# Patient Record
Sex: Female | Born: 1970 | Race: White | Hispanic: No | Marital: Married | Smoking: Never smoker
Health system: Southern US, Community
[De-identification: ages and names within clinical notes are randomized; demographics above are authoritative.]

## PROBLEM LIST (undated history)

## (undated) DIAGNOSIS — M545 Low back pain, unspecified: Secondary | ICD-10-CM

## (undated) DIAGNOSIS — E559 Vitamin D deficiency, unspecified: Secondary | ICD-10-CM

## (undated) DIAGNOSIS — R112 Nausea with vomiting, unspecified: Secondary | ICD-10-CM

## (undated) DIAGNOSIS — R942 Abnormal results of pulmonary function studies: Secondary | ICD-10-CM

## (undated) DIAGNOSIS — Z9889 Other specified postprocedural states: Secondary | ICD-10-CM

## (undated) DIAGNOSIS — R0602 Shortness of breath: Secondary | ICD-10-CM

## (undated) DIAGNOSIS — Z91018 Allergy to other foods: Secondary | ICD-10-CM

## (undated) DIAGNOSIS — N840 Polyp of corpus uteri: Secondary | ICD-10-CM

## (undated) HISTORY — DX: Low back pain, unspecified: M54.50

## (undated) HISTORY — PX: POLYPECTOMY: SHX149

## (undated) HISTORY — DX: Abnormal results of pulmonary function studies: R94.2

## (undated) HISTORY — DX: Allergy to other foods: Z91.018

## (undated) HISTORY — DX: Vitamin D deficiency, unspecified: E55.9

## (undated) HISTORY — DX: Shortness of breath: R06.02

---

## 1998-12-06 ENCOUNTER — Ambulatory Visit (HOSPITAL_COMMUNITY): Admission: RE | Admit: 1998-12-06 | Discharge: 1998-12-06 | Payer: Self-pay | Admitting: Family Medicine

## 1998-12-06 ENCOUNTER — Encounter: Payer: Self-pay | Admitting: Family Medicine

## 1999-10-06 ENCOUNTER — Other Ambulatory Visit: Admission: RE | Admit: 1999-10-06 | Discharge: 1999-10-06 | Payer: Self-pay | Admitting: *Deleted

## 2000-10-28 ENCOUNTER — Other Ambulatory Visit: Admission: RE | Admit: 2000-10-28 | Discharge: 2000-10-28 | Payer: Self-pay | Admitting: *Deleted

## 2001-12-08 ENCOUNTER — Other Ambulatory Visit: Admission: RE | Admit: 2001-12-08 | Discharge: 2001-12-08 | Payer: Self-pay | Admitting: *Deleted

## 2003-05-03 ENCOUNTER — Other Ambulatory Visit: Admission: RE | Admit: 2003-05-03 | Discharge: 2003-05-03 | Payer: Self-pay | Admitting: *Deleted

## 2013-03-12 HISTORY — PX: POLYPECTOMY: SHX149

## 2013-11-19 ENCOUNTER — Other Ambulatory Visit: Payer: Self-pay | Admitting: Obstetrics and Gynecology

## 2013-11-19 DIAGNOSIS — R928 Other abnormal and inconclusive findings on diagnostic imaging of breast: Secondary | ICD-10-CM

## 2013-11-25 ENCOUNTER — Ambulatory Visit
Admission: RE | Admit: 2013-11-25 | Discharge: 2013-11-25 | Disposition: A | Discharge status: Home / Self Care | Payer: PRIVATE HEALTH INSURANCE | Source: Ambulatory Visit | Attending: Obstetrics and Gynecology | Admitting: Obstetrics and Gynecology

## 2013-11-25 ENCOUNTER — Encounter (INDEPENDENT_AMBULATORY_CARE_PROVIDER_SITE_OTHER): Payer: Self-pay

## 2013-11-25 DIAGNOSIS — R928 Other abnormal and inconclusive findings on diagnostic imaging of breast: Secondary | ICD-10-CM

## 2013-12-31 NOTE — H&P (Signed)
  Patient name  Madeline, Warren DICTATION#  [id number] CSN#   [id number]  [facility], MD 12/31/2013 11:04 AM

## 2014-01-01 ENCOUNTER — Encounter (HOSPITAL_BASED_OUTPATIENT_CLINIC_OR_DEPARTMENT_OTHER): Payer: Self-pay | Admitting: *Deleted

## 2014-01-01 NOTE — H&P (Signed)
Madeline Warren, STUKES NO.:  [id number]  MEDICAL RECORD NO.:  [id number]  LOCATION:                                 FACILITY:  PHYSICIAN:  Darlyn Chamber, M.D.   DATE OF BIRTH:  03-20-1970  DATE OF ADMISSION:  01/07/2014 DATE OF DISCHARGE:                             HISTORY & PHYSICAL   The patient is a 43 year old, gravida 3, para 3, married female who presents for hysteroscopy with D and C.  She has had a history of increasing menstrual irregularities.  Because of this, she underwent a saline infusion ultrasound.  She had evidently an endometrial polyp. She was started on Prometrium for her bleeding.  Continued to have what look like a possible polyp and is now scheduled for hysteroscopy with D and C.  ALLERGIES:  In terms of allergies, she has no known drug allergies.  MEDICATIONS:  She is on no medications.  PAST MEDICAL HISTORY:  Usual childhood diseases.  No significant sequelae.  PAST SURGICAL HISTORY:  She has had 1 cesarean section.  FAMILY HISTORY:  Noncontributory.  SOCIAL HISTORY:  Reveals no tobacco or alcohol use.  REVIEW OF SYSTEMS:  Noncontributory.  PHYSICAL EXAMINATION:  VITAL SIGNS:  The patient is afebrile, stable vital signs. HEENT:  The patient is normocephalic.  Pupils equal, round, and reactive to light and accommodation.  Extraocular movements were intact.  Sclerae and conjunctivae clear.  Oropharynx clear. NECK:  Without thyromegaly. BREASTS:  Not examined. LUNGS:  Clear. CARDIOVASCULAR SYSTEM:  Regular rhythm and rate.  There are no murmurs or gallops. ABDOMEN:  Benign. PELVIC:  Normal external genitalia.  Vaginal mucosa is clear.  Cervix unremarkable.  Uterus normal size, shape, and contour.  Adnexa free of masses or tenderness. EXTREMITIES:  Trace edema. NEUROLOGIC:  Grossly within normal limits.  IMPRESSION:  Anovulatory cycle with evidence of endometrial polyp.  PLAN:  The patient to undergo hysteroscopy D and  C.  The risks of surgery have been discussed including the risk of infection.  The risk of hemorrhage that could require transfusion with the risk of AIDS or hepatitis.  Excessive bleeding could require hysterectomy.  Risk of injury to adjacent organs and perforation such as bowel, this could require further exploratory surgery.  Risk of deep venous thrombosis and pulmonary embolus.  The patient expressed understanding indications and risks.     Darlyn Chamber, M.D.     JSM/MEDQ  D:  12/31/2013  T:  12/31/2013  Job:  [id number]

## 2014-01-05 ENCOUNTER — Encounter (HOSPITAL_BASED_OUTPATIENT_CLINIC_OR_DEPARTMENT_OTHER): Payer: Self-pay | Admitting: *Deleted

## 2014-01-05 NOTE — Progress Notes (Signed)
To [facility] at 0600-CBC,serum pregnancy on arrival-instructed NPO after Mn-

## 2014-01-06 ENCOUNTER — Encounter (HOSPITAL_BASED_OUTPATIENT_CLINIC_OR_DEPARTMENT_OTHER): Payer: Self-pay | Admitting: Anesthesiology

## 2014-01-06 NOTE — Anesthesia Preprocedure Evaluation (Addendum)
Anesthesia Evaluation  Patient identified by MRN, date of birth, ID band Patient awake    Reviewed: Allergy & Precautions, H&P , NPO status , Patient's Chart, lab work & pertinent test results  History of Anesthesia Complications (+) PONV and history of anesthetic complications  Airway Mallampati: II  TM Distance: >3 FB Neck ROM: Full    Dental no notable dental hx.    Pulmonary neg pulmonary ROS,  breath sounds clear to auscultation  Pulmonary exam normal       Cardiovascular negative cardio ROS  Rhythm:Regular Rate:Normal     Neuro/Psych negative neurological ROS  negative psych ROS   GI/Hepatic negative GI ROS, Neg liver ROS,   Endo/Other  Morbid obesity  Renal/GU negative Renal ROS  negative genitourinary   Musculoskeletal negative musculoskeletal ROS (+)   Abdominal (+) + obese,   Peds negative pediatric ROS (+)  Hematology negative hematology ROS (+)   Anesthesia Other Findings   Reproductive/Obstetrics negative OB ROS                             Anesthesia Physical Anesthesia Plan  ASA: III  Anesthesia Plan: MAC   Post-op Pain Management:    Induction: Intravenous  Airway Management Planned:   Additional Equipment:   Intra-op Plan:   Post-operative Plan:   Informed Consent: I have reviewed the patients History and Physical, chart, labs and discussed the procedure including the risks, benefits and alternatives for the proposed anesthesia with the patient or authorized representative who has indicated his/her understanding and acceptance.   Dental advisory given  Plan Discussed with: CRNA  Anesthesia Plan Comments: (Discussed MAC and general. She prefers MAC with GA backup.)       Anesthesia Quick Evaluation

## 2014-01-07 ENCOUNTER — Encounter (HOSPITAL_BASED_OUTPATIENT_CLINIC_OR_DEPARTMENT_OTHER)
Admission: RE | Disposition: A | Discharge status: Home / Self Care | Payer: Self-pay | Source: Ambulatory Visit | Attending: Obstetrics and Gynecology

## 2014-01-07 ENCOUNTER — Encounter (HOSPITAL_BASED_OUTPATIENT_CLINIC_OR_DEPARTMENT_OTHER): Payer: PRIVATE HEALTH INSURANCE | Admitting: Anesthesiology

## 2014-01-07 ENCOUNTER — Ambulatory Visit (HOSPITAL_BASED_OUTPATIENT_CLINIC_OR_DEPARTMENT_OTHER): Payer: PRIVATE HEALTH INSURANCE | Admitting: Anesthesiology

## 2014-01-07 ENCOUNTER — Encounter (HOSPITAL_BASED_OUTPATIENT_CLINIC_OR_DEPARTMENT_OTHER): Payer: Self-pay | Admitting: *Deleted

## 2014-01-07 ENCOUNTER — Ambulatory Visit (HOSPITAL_BASED_OUTPATIENT_CLINIC_OR_DEPARTMENT_OTHER)
Admission: RE | Admit: 2014-01-07 | Discharge: 2014-01-07 | Disposition: A | Discharge status: Home / Self Care | Payer: PRIVATE HEALTH INSURANCE | Source: Ambulatory Visit | Attending: Obstetrics and Gynecology | Admitting: Obstetrics and Gynecology

## 2014-01-07 DIAGNOSIS — N926 Irregular menstruation, unspecified: Secondary | ICD-10-CM | POA: Insufficient documentation

## 2014-01-07 DIAGNOSIS — N840 Polyp of corpus uteri: Secondary | ICD-10-CM | POA: Diagnosis present

## 2014-01-07 DIAGNOSIS — D261 Other benign neoplasm of corpus uteri: Secondary | ICD-10-CM | POA: Insufficient documentation

## 2014-01-07 DIAGNOSIS — Z9889 Other specified postprocedural states: Secondary | ICD-10-CM

## 2014-01-07 HISTORY — PX: DILATATION & CURRETTAGE/HYSTEROSCOPY WITH RESECTOCOPE: SHX5572

## 2014-01-07 HISTORY — DX: Nausea with vomiting, unspecified: R11.2

## 2014-01-07 HISTORY — DX: Other specified postprocedural states: Z98.890

## 2014-01-07 HISTORY — DX: Polyp of corpus uteri: N84.0

## 2014-01-07 LAB — CBC
HCT: 33.5 % — ABNORMAL LOW (ref 36.0–46.0)
Hemoglobin: 10.5 g/dL — ABNORMAL LOW (ref 12.0–15.0)
MCH: 25.2 pg — ABNORMAL LOW (ref 26.0–34.0)
MCHC: 31.3 g/dL (ref 30.0–36.0)
MCV: 80.3 fL (ref 78.0–100.0)
PLATELETS: 287 10*3/uL (ref 150–400)
RBC: 4.17 MIL/uL (ref 3.87–5.11)
RDW: 15.1 % (ref 11.5–15.5)
WBC: 10.6 10*3/uL — AB (ref 4.0–10.5)

## 2014-01-07 LAB — HCG, SERUM, QUALITATIVE: PREG SERUM: NEGATIVE

## 2014-01-07 SURGERY — DILATATION & CURETTAGE/HYSTEROSCOPY WITH RESECTOCOPE
Anesthesia: Monitor Anesthesia Care | Site: Vagina

## 2014-01-07 MED ORDER — LACTATED RINGERS IV SOLN
INTRAVENOUS | Status: DC
  Administered 2014-01-07: 06:00:00 via INTRAVENOUS
  Filled 2014-01-07: qty 1000

## 2014-01-07 MED ORDER — CIPROFLOXACIN IN D5W 400 MG/200ML IV SOLN
INTRAVENOUS | Status: AC
  Filled 2014-01-07: qty 200

## 2014-01-07 MED ORDER — MIDAZOLAM HCL 5 MG/5ML IJ SOLN
INTRAMUSCULAR | Status: DC | PRN
  Administered 2014-01-07: 2 mg via INTRAVENOUS

## 2014-01-07 MED ORDER — LIDOCAINE-EPINEPHRINE 1 %-1:100000 IJ SOLN
INTRAMUSCULAR | Status: DC | PRN
  Administered 2014-01-07: 20 mL

## 2014-01-07 MED ORDER — DEXAMETHASONE SODIUM PHOSPHATE 4 MG/ML IJ SOLN
INTRAMUSCULAR | Status: DC | PRN
  Administered 2014-01-07: 4 mg via INTRAVENOUS

## 2014-01-07 MED ORDER — CIPROFLOXACIN IN D5W 400 MG/200ML IV SOLN
INTRAVENOUS | Status: DC | PRN
  Administered 2014-01-07: 400 mg via INTRAVENOUS

## 2014-01-07 MED ORDER — PROMETHAZINE HCL 25 MG/ML IJ SOLN
6.2500 mg | INTRAMUSCULAR | Status: DC | PRN
  Filled 2014-01-07: qty 1

## 2014-01-07 MED ORDER — GLYCINE 1.5 % IR SOLN
Status: DC | PRN
  Administered 2014-01-07: 3000 mL

## 2014-01-07 MED ORDER — FENTANYL CITRATE 0.05 MG/ML IJ SOLN
INTRAMUSCULAR | Status: AC
  Filled 2014-01-07: qty 2

## 2014-01-07 MED ORDER — FENTANYL CITRATE 0.05 MG/ML IJ SOLN
25.0000 ug | INTRAMUSCULAR | Status: DC | PRN
  Filled 2014-01-07: qty 1

## 2014-01-07 MED ORDER — ONDANSETRON HCL 4 MG/2ML IJ SOLN
INTRAMUSCULAR | Status: DC | PRN
  Administered 2014-01-07: 4 mg via INTRAVENOUS

## 2014-01-07 MED ORDER — MIDAZOLAM HCL 2 MG/2ML IJ SOLN
INTRAMUSCULAR | Status: AC
  Filled 2014-01-07: qty 2

## 2014-01-07 MED ORDER — LIDOCAINE HCL (CARDIAC) 20 MG/ML IV SOLN
INTRAVENOUS | Status: DC | PRN
  Administered 2014-01-07 (×2): 40 mg via INTRAVENOUS

## 2014-01-07 MED ORDER — PROPOFOL 10 MG/ML IV EMUL
INTRAVENOUS | Status: DC | PRN
  Administered 2014-01-07: 100 ug/kg/min via INTRAVENOUS

## 2014-01-07 MED ORDER — CEFAZOLIN SODIUM-DEXTROSE 2-3 GM-% IV SOLR
2.0000 g | INTRAVENOUS | Status: DC
  Filled 2014-01-07: qty 50

## 2014-01-07 MED ORDER — FENTANYL CITRATE 0.05 MG/ML IJ SOLN
INTRAMUSCULAR | Status: DC | PRN
  Administered 2014-01-07 (×2): 50 ug via INTRAVENOUS

## 2014-01-07 MED ORDER — CEFAZOLIN SODIUM-DEXTROSE 2-3 GM-% IV SOLR
INTRAVENOUS | Status: AC
  Filled 2014-01-07: qty 50

## 2014-01-07 SURGICAL SUPPLY — 27 items
CANISTER SUCTION 2500CC (MISCELLANEOUS) ×3 IMPLANT
CATH ROBINSON RED A/P 16FR (CATHETERS) IMPLANT
CORD ACTIVE DISPOSABLE (ELECTRODE)
CORD ELECTRO ACTIVE DISP (ELECTRODE) IMPLANT
COVER TABLE BACK 60X90 (DRAPES) ×3 IMPLANT
DRAPE CAMERA CLOSED 9X96 (DRAPES) ×3 IMPLANT
DRAPE LG THREE QUARTER DISP (DRAPES) ×3 IMPLANT
ELECT LOOP GYNE PRO 24FR (CUTTING LOOP) ×6
ELECT REM PT RETURN 9FT ADLT (ELECTROSURGICAL) ×3
ELECT VAPORTRODE GRVD BAR (ELECTRODE) IMPLANT
ELECTRODE LOOP GYNE PRO 24FR (CUTTING LOOP) ×2 IMPLANT
ELECTRODE REM PT RTRN 9FT ADLT (ELECTROSURGICAL) ×1 IMPLANT
GLOVE BIO SURGEON STRL SZ7 (GLOVE) ×6 IMPLANT
GLOVE BIOGEL PI IND STRL 6.5 (GLOVE) ×1 IMPLANT
GLOVE BIOGEL PI INDICATOR 6.5 (GLOVE) ×2
GLOVE INDICATOR 6.5 STRL GRN (GLOVE) ×3 IMPLANT
GOWN PREVENTION PLUS LG XLONG (DISPOSABLE) ×6 IMPLANT
GOWN STRL REUS W/TWL LRG LVL3 (GOWN DISPOSABLE) ×3 IMPLANT
LEGGING LITHOTOMY PAIR STRL (DRAPES) ×3 IMPLANT
PACK BASIN DAY SURGERY FS (CUSTOM PROCEDURE TRAY) ×3 IMPLANT
PAD OB MATERNITY 4.3X12.25 (PERSONAL CARE ITEMS) ×3 IMPLANT
PAD PREP 24X48 CUFFED NSTRL (MISCELLANEOUS) ×3 IMPLANT
SET TUBING HYSTEROSCOPY 2 NDL (TUBING) ×3 IMPLANT
TOWEL OR 17X24 6PK STRL BLUE (TOWEL DISPOSABLE) ×6 IMPLANT
TRAY DSU PREP LF (CUSTOM PROCEDURE TRAY) ×3 IMPLANT
TUBE HYSTEROSCOPY W Y-CONNECT (TUBING) ×3 IMPLANT
WATER STERILE IRR 500ML POUR (IV SOLUTION) ×3 IMPLANT

## 2014-01-07 NOTE — Transfer of Care (Addendum)
Immediate Anesthesia Transfer of Care Note  Patient: Madeline Warren  Procedure(s) Performed: Procedure(s) (LRB): DILATATION & CURETTAGE/HYSTEROSCOPY WITH RESECTOCOPE (N/A)  Patient Location: PACU  Anesthesia Type: MAC  Level of Consciousness: awake, alert  and oriented  Airway & Oxygen Therapy: Patient Spontanous Breathing and Patient connected to face mask oxygen  Post-op Assessment: Report given to PACU RN and Post -op Vital signs reviewed and stable  Post vital signs: Reviewed and stable  Complications: No apparent anesthesia complications

## 2014-01-07 NOTE — Brief Op Note (Signed)
01/07/2014  7:45 AM  PATIENT:  Madeline Warren  43 y.o. female  PRE-OPERATIVE DIAGNOSIS:  polyp  POST-OPERATIVE DIAGNOSIS:  polyp  PROCEDURE:  Procedure(s): DILATATION & CURETTAGE/HYSTEROSCOPY WITH RESECTOCOPE (N/A)  SURGEON:  Surgeon(s) and Role:    * Darlyn Chamber, MD - Primary  PHYSICIAN ASSISTANT:   ASSISTANTS: none   ANESTHESIA:   IV sedation and paracervical block  EBL:  Total I/O In: 200 [I.V.:200] Out: -   BLOOD ADMINISTERED:none  DRAINS: none   LOCAL MEDICATIONS USED:  XYLOCAINE   SPECIMEN:  Source of Specimen:  endometrial polyp and currettings  DISPOSITION OF SPECIMEN:  PATHOLOGY  COUNTS:  YES  TOURNIQUET:  * No tourniquets in log *  DICTATION: .Other Dictation: Dictation Number [id number]  PLAN OF CARE: Discharge to home after PACU  PATIENT DISPOSITION:  PACU - hemodynamically stable.   Delay start of Pharmacological VTE agent (>24hrs) due to surgical blood loss or risk of bleeding: not applicable

## 2014-01-07 NOTE — Discharge Instructions (Signed)
° °  D & C Home care Instructions: ° ° °Personal hygiene:  Used sanitary napkins for vaginal drainage not tampons. Shower or tub bathe the day after your procedure. No douching until bleeding stops. Always wipe from front to back after  Elimination. ° °Activity: Do not drive or operate any equipment today. The effects of the anesthesia are still present and drowsiness may result. Rest today, not necessarily flat bed rest, just take it easy. You may resume your normal activity in one to 2 days. ° °Sexual activity: No intercourse for one week or as indicated by your physician ° °Diet: Eat a light diet as desired this evening. You may resume a regular diet tomorrow. ° °Return to work: One to 2 days. ° °General Expectations of your surgery: Vaginal bleeding should be no heavier than a normal period. Spotting may continue up to 10 days. Mild cramps may continue for a couple of days. You may have a regular period in 2-6 weeks. ° °Unexpected observations call your doctor if these occur: persistent or heavy bleeding. Severe abdominal cramping or pain. Elevation of temperature greater than 100°F. ° °Call for an appointment in one week. ° ° ° °Patient's Signature_______________________________________________________ ° °Nurse's Signature________________________________________________________ ° ° °Post Anesthesia Home Care Instructions ° °Activity: °Get plenty of rest for the remainder of the day. A responsible adult should stay with you for 24 hours following the procedure.  °For the next 24 hours, DO NOT: °-Drive a car °-Operate machinery °-Drink alcoholic beverages °-Take any medication unless instructed by your physician °-Make any legal decisions or sign important papers. ° °Meals: °Start with liquid foods such as gelatin or soup. Progress to regular foods as tolerated. Avoid greasy, spicy, heavy foods. If nausea and/or vomiting occur, drink only clear liquids until the nausea and/or vomiting subsides. Call your physician  if vomiting continues. ° °Special Instructions/Symptoms: °Your throat may feel dry or sore from the anesthesia or the breathing tube placed in your throat during surgery. If this causes discomfort, gargle with warm salt water. The discomfort should disappear within 24 hours. ° °

## 2014-01-07 NOTE — H&P (Signed)
  History and physical exam unchanged 

## 2014-01-07 NOTE — Op Note (Signed)
NAMETHEODORE, RAHRIG NO.:  [id number]  MEDICAL RECORD NO.:  [id number]  LOCATION:                                 FACILITY:  PHYSICIAN:  Darlyn Chamber, M.D.        DATE OF BIRTH:  DATE OF PROCEDURE:  01/07/2014 DATE OF DISCHARGE:                              OPERATIVE REPORT   PREOPERATIVE DIAGNOSIS:  Endometrial polyp.  POSTOPERATIVE DIAGNOSIS:  Endometrial polyp.  PROCEDURE:  Paracervical block, cervical dilation, hysteroscopy, resection of endometrial polyp.  Endometrial curettings.  SURGEON:  Darlyn Chamber, MD  ANESTHESIA:  Paracervical block with sedation.  BLOOD LOSS:  Minimal.  PACKS AND DRAINS:  None.  INTRAOPERATIVE BLOOD PLACED:  None.  COMPLICATIONS:  None.  INDICATION:  Dictated in history and physical.  PROCEDURE IN DETAIL:  The patient was taken to OR and placed in supine position.  After slight sedation, she was placed in a dorsal lithotomy position using Allen stirrups.  Perineum and vagina were prepped out with Betadine and draped as sterile field.  A speculum was placed in the vaginal vault.  The cervix was grasped with single-tooth tenaculum. Paracervical block was instituted using 20 mL of 1% Xylocaine with epinephrine.  Uterus sounded to approximately 12 cm.  Cervix was dilated to a size 33 Pratt dilator.  Operative hysteroscope was introduced. Intrauterine cavity was distended using glycine.  Visualization revealed a posterior wall polyp.  It was resected and sent for Pathology.  Rest of the endometrium was relatively unremarkable.  Endometrial curettings were obtained.  Total deficit was 95 mL. There were no signs of perforation or other complications.  No active bleeding was encounter.  Single-tooth tenaculum and speculum then removed.  The patient taken out of dorsal lithotomy position, once alert, transferred to recovery room in good condition.  Sponge, instrument, and needle count was reported as correct by the  circulating nurse.     Darlyn Chamber, M.D.     JSM/MEDQ  D:  01/07/2014  T:  01/07/2014  Job:  [id number]

## 2014-01-07 NOTE — Anesthesia Postprocedure Evaluation (Signed)
  Anesthesia Post-op Note  Patient: Madeline Warren  Procedure(s) Performed: Procedure(s) (LRB): DILATATION & CURETTAGE/HYSTEROSCOPY WITH RESECTOCOPE (N/A)  Patient Location: PACU  Anesthesia Type: MAC  Level of Consciousness: awake and alert   Airway and Oxygen Therapy: Patient Spontanous Breathing  Post-op Pain: mild  Post-op Assessment: Post-op Vital signs reviewed, Patient's Cardiovascular Status Stable, Respiratory Function Stable, Patent Airway and No signs of Nausea or vomiting  Last Vitals:  Filed Vitals:   01/07/14 0845  BP: 102/53  Pulse: 67  Temp:   Resp: 14    Post-op Vital Signs: stable   Complications: No apparent anesthesia complications

## 2014-01-07 NOTE — Anesthesia Procedure Notes (Signed)
Procedure Name: MAC Performed by: Mechele Claude Pre-anesthesia Checklist: Patient identified, Emergency Drugs available, Suction available, Patient being monitored and Timeout performed Patient Re-evaluated:Patient Re-evaluated prior to inductionOxygen Delivery Method: Simple face mask Intubation Type: IV induction Airway Equipment and Method: Oral airway Placement Confirmation: positive ETCO2 Dental Injury: Teeth and Oropharynx as per pre-operative assessment

## 2014-01-08 ENCOUNTER — Encounter (HOSPITAL_BASED_OUTPATIENT_CLINIC_OR_DEPARTMENT_OTHER): Payer: Self-pay | Admitting: Obstetrics and Gynecology

## 2014-12-07 ENCOUNTER — Other Ambulatory Visit: Payer: Self-pay | Admitting: Obstetrics and Gynecology

## 2014-12-08 LAB — CYTOLOGY - PAP

## 2016-07-27 DIAGNOSIS — M25562 Pain in left knee: Secondary | ICD-10-CM | POA: Insufficient documentation

## 2016-07-27 DIAGNOSIS — G8929 Other chronic pain: Secondary | ICD-10-CM | POA: Insufficient documentation

## 2018-06-27 DIAGNOSIS — M79672 Pain in left foot: Secondary | ICD-10-CM | POA: Insufficient documentation

## 2018-06-27 DIAGNOSIS — S93602A Unspecified sprain of left foot, initial encounter: Secondary | ICD-10-CM | POA: Insufficient documentation

## 2018-07-31 DIAGNOSIS — M722 Plantar fascial fibromatosis: Secondary | ICD-10-CM | POA: Insufficient documentation

## 2019-12-01 ENCOUNTER — Ambulatory Visit (INDEPENDENT_AMBULATORY_CARE_PROVIDER_SITE_OTHER): Payer: Commercial Managed Care - PPO | Admitting: Pulmonary Disease

## 2019-12-01 ENCOUNTER — Other Ambulatory Visit: Payer: Self-pay

## 2019-12-01 ENCOUNTER — Encounter: Payer: Self-pay | Admitting: Pulmonary Disease

## 2019-12-01 VITALS — BP 124/80 | HR 80 | Temp 98.2°F | Ht 66.0 in | Wt 240.8 lb

## 2019-12-01 DIAGNOSIS — R05 Cough: Secondary | ICD-10-CM | POA: Diagnosis not present

## 2019-12-01 DIAGNOSIS — E669 Obesity, unspecified: Secondary | ICD-10-CM | POA: Diagnosis not present

## 2019-12-01 DIAGNOSIS — R0602 Shortness of breath: Secondary | ICD-10-CM

## 2019-12-01 DIAGNOSIS — R059 Cough, unspecified: Secondary | ICD-10-CM

## 2019-12-01 DIAGNOSIS — R0600 Dyspnea, unspecified: Secondary | ICD-10-CM | POA: Diagnosis not present

## 2019-12-01 DIAGNOSIS — Z6838 Body mass index (BMI) 38.0-38.9, adult: Secondary | ICD-10-CM

## 2019-12-01 DIAGNOSIS — R0609 Other forms of dyspnea: Secondary | ICD-10-CM

## 2019-12-01 MED ORDER — ALBUTEROL SULFATE HFA 108 (90 BASE) MCG/ACT IN AERS: 2.0000 | INHALATION_SPRAY | Freq: Four times a day (QID) | RESPIRATORY_TRACT | 6 refills | Status: DC | PRN

## 2019-12-01 NOTE — Progress Notes (Signed)
Synopsis: Referred by Nelda Bucks for chronic cough  Subjective:   PATIENT ID: Madeline Warren GENDER: female DOB: [birth year], MRN: [id number]  HPI  Chief Complaint  Patient presents with  . Consult    She has chest pain and some SOB. Cough with various colors of sputum   Madeline Warren is a 49 year old woman, non-smoker with history of obesity who is referred to pulmonary clinic for chronic cough.   She reports having a cough since last year. She reports a flu-like illness 04/2018 and has noticed a cough since. The cough is productive with sputum that has been thick, whitish yellow and has had red flecks at times. She also experiences exertional shortness of breath and chest tightness. She reports an episode of wheezing with exertion when she was out [place] on a short hike where she experienced wheezing with the dyspnea. She denies sinus congestion or drainage. The cough does not awake her from sleep at night. She denies reflux symptoms except with spicy food which she eats sparingly and will take ranitidine as needed.  She has not been prescribed any medication or treatment for the shortness of breath and cough. She reports taking antibiotics (doxycycline) for a skin infection and is unable to report on any improvement in her cough at the time. She had a chest radiograph this summer which was concerning for pulmonary nodules and a CT Chest scan was performed which did not indicate pulmonary nodules, parenchymal or airway issues. No adenopathy noted. She had PFTs performed at [facility] but we do not have those on file at this time.  Her weight has been stable over the past year. She weighs between 235-240lbs. She follows a vegan diet.  She denies smoking, Her father smoked and she was exposed to second hand smoke growing up. 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 She reports her mother had adult onset  asthma.  Past Medical History:  Diagnosis Date  . Endometrial polyp   . PONV (postoperative nausea and vomiting)    after c-section     Family History  Problem Relation Age of Onset  . Asthma Mother      Social History   Socioeconomic History  . Marital status: Married    Spouse name: Not on file  . Number of children: Not on file  . Years of education: Not on file  . Highest education level: Not on file  Occupational History  . Not on file  Tobacco Use  . Smoking status: Never Smoker  . Smokeless tobacco: Never Used  Substance and Sexual Activity  . Alcohol use: Yes    Comment: rare  . Drug use: No  . Sexual activity: Not on file  Other Topics Concern  . Not on file  Social History Narrative  . Not on file   Social Determinants of Health   Financial Resource Strain:   . Difficulty of Paying Living Expenses: Not on file  Food Insecurity:   . Worried About Charity fundraiser in the Last Year: Not on file  . Ran Out of Food in the Last Year: Not on file  Transportation Needs:   . Lack of Transportation (Medical): Not on file  . Lack of Transportation (Non-Medical): Not on file  Physical Activity:   . Days of Exercise per Week: Not on file  . Minutes of Exercise per Session: Not on file  Stress:   .  Feeling of Stress : Not on file  Social Connections:   . Frequency of Communication with Friends and Family: Not on file  . Frequency of Social Gatherings with Friends and Family: Not on file  . Attends Religious Services: Not on file  . Active Member of Clubs or Organizations: Not on file  . Attends Archivist Meetings: Not on file  . Marital Status: Not on file  Intimate Partner Violence:   . Fear of Current or Ex-Partner: Not on file  . Emotionally Abused: Not on file  . Physically Abused: Not on file  . Sexually Abused: Not on file     Allergies  Allergen Reactions  . Avocado Nausea And Vomiting    Face becomes flushed also  . Amoxicillin  Rash     Outpatient Medications Prior to Visit  Medication Sig Dispense Refill  . Multiple Vitamin (MULTIVITAMIN) tablet Take 1 tablet by mouth daily.    . progesterone (PROMETRIUM) 100 MG capsule Take 100 mg by mouth daily. Takes first 12 days of the month (Patient not taking: Reported on 12/01/2019)     No facility-administered medications prior to visit.    Review of Systems  Constitutional: Negative for chills, diaphoresis, fever, malaise/fatigue and weight loss.  HENT: Negative for congestion, nosebleeds, sinus pain and sore throat.   Eyes: Negative for blurred vision and pain.  Respiratory: Positive for cough, sputum production, shortness of breath and wheezing. Negative for hemoptysis.   Cardiovascular: Negative for chest pain, palpitations, orthopnea, claudication, leg swelling and PND.       Chest tightness  Gastrointestinal: Negative for abdominal pain, blood in stool, constipation, diarrhea, heartburn, melena, nausea and vomiting.  Genitourinary: Negative for dysuria and hematuria.  Musculoskeletal: Negative for joint pain and myalgias.  Skin: Negative for itching and rash.  Neurological: Negative for dizziness, weakness and headaches.  Endo/Heme/Allergies: Does not bruise/bleed easily.  Psychiatric/Behavioral: Negative.     Objective:   Vitals:   12/01/19 1059  BP: 124/80  Pulse: 80  Temp: 98.2 F (36.8 C)  TempSrc: Oral  SpO2: 98%  Weight: 240 lb 12.8 oz (109.2 kg)  Height: 5\' 6"  (1.676 m)   Physical Exam Constitutional:      General: She is not in acute distress.    Appearance: Normal appearance. She is obese. She is not ill-appearing.  HENT:     Head: Normocephalic and atraumatic.     Nose: Nose normal. No congestion or rhinorrhea.     Mouth/Throat:     Mouth: Mucous membranes are moist.     Pharynx: Oropharynx is clear. No posterior oropharyngeal erythema.  Eyes:     Extraocular Movements: Extraocular movements intact.     Conjunctiva/sclera:  Conjunctivae normal.     Pupils: Pupils are equal, round, and reactive to light.  Cardiovascular:     Rate and Rhythm: Normal rate and regular rhythm.     Pulses: Normal pulses.     Heart sounds: Normal heart sounds. No murmur heard.   Pulmonary:     Effort: Pulmonary effort is normal.     Breath sounds: No wheezing, rhonchi or rales.     Comments: Diminished breath sounds throughout Abdominal:     General: Bowel sounds are normal. There is no distension.     Palpations: Abdomen is soft.     Tenderness: There is no abdominal tenderness.  Musculoskeletal:     Cervical back: Neck supple.     Right lower leg: No edema.  Left lower leg: No edema.  Lymphadenopathy:     Cervical: No cervical adenopathy.  Skin:    General: Skin is warm and dry.  Neurological:     General: No focal deficit present.     Mental Status: She is alert and oriented to person, place, and time. Mental status is at baseline.  Psychiatric:        Mood and Affect: Mood normal.        Behavior: Behavior normal.        Thought Content: Thought content normal.        Judgment: Judgment normal.    CBC    Component Value Date/Time   WBC 10.6 (H) 01/07/2014 0610   RBC 4.17 01/07/2014 0610   HGB 10.5 (L) 01/07/2014 0610   HCT 33.5 (L) 01/07/2014 0610   PLT 287 01/07/2014 0610   MCV 80.3 01/07/2014 0610   MCH 25.2 (L) 01/07/2014 0610   MCHC 31.3 01/07/2014 0610   RDW 15.1 01/07/2014 0610   Chest imaging: CT Chest 09/2019 reviewed; no pulmonary nodules, opacities or airway lesions noted. No effusions.  PFT: No PFTs on file  Labs: No recent labs on file  Assessment & Plan:   Cough  Dyspnea on exertion  Obesity (BMI 35.0-39.9 without comorbidity) - Plan: Amb Ref to Medical Weight Management  Discussion: Madeline Warren is a 49 year old woman, non-smoker who presents with chronic cough. She thinks the cough started and persisted after a flu-like illness in February 2020. She has chest tightness and  dyspnea as well. She may have developed a post-viral reactive airways syndrome. There is also concern for possible adult onset asthma  or obstructive lung disease, as she had exposure to second hand smoke while growing up. Unfortunately I do not have the PFT results for review. We will request these records from [facility]. In the mean time, she is to start albuterol inhaler 1-2 puffs every 4-6 hours as needed. We will call her once the PFTs have been reviewed and consider additional inhaler therapy or having her come to our clinic for a full set of PFTs if we are unable to obtain those records. Her cough does not appear to be associated with allergies, GERD or sinus disease.  She reports having trouble with weight loss and we discussed referral to the weight loss management clinic which she is open to trying for further assistance. She follows a vegan diet and would appreciate recommendations.  She is to follow up in 3-4 months   Freda Jackson, MD [facility] Office: [phone]   Current Outpatient Medications:  .  albuterol (VENTOLIN HFA) 108 (90 Base) MCG/ACT inhaler, Inhale 2 puffs into the lungs every 6 (six) hours as needed for wheezing or shortness of breath., Disp: 8 g, Rfl: 6 .  Multiple Vitamin (MULTIVITAMIN) tablet, Take 1 tablet by mouth daily., Disp: , Rfl:  .  progesterone (PROMETRIUM) 100 MG capsule, Take 100 mg by mouth daily. Takes first 12 days of the month (Patient not taking: Reported on 12/01/2019), Disp: , Rfl:

## 2019-12-01 NOTE — Patient Instructions (Signed)
-   Use albuterol 1-2 puffs every 4-6 hours as needed for cough, shortness of breath and chest tightness - We will follow up the pulmonary function tests from [facility] and call you with any changes to inhaler therapy - Referral to weight management clinic

## 2019-12-30 ENCOUNTER — Ambulatory Visit (INDEPENDENT_AMBULATORY_CARE_PROVIDER_SITE_OTHER): Payer: Commercial Managed Care - PPO | Admitting: Bariatrics

## 2019-12-30 ENCOUNTER — Other Ambulatory Visit: Payer: Self-pay

## 2019-12-30 ENCOUNTER — Encounter (INDEPENDENT_AMBULATORY_CARE_PROVIDER_SITE_OTHER): Payer: Self-pay | Admitting: Bariatrics

## 2019-12-30 VITALS — BP 116/81 | HR 67 | Temp 97.9°F | Ht 65.0 in | Wt 237.0 lb

## 2019-12-30 DIAGNOSIS — Z1331 Encounter for screening for depression: Secondary | ICD-10-CM

## 2019-12-30 DIAGNOSIS — R5383 Other fatigue: Secondary | ICD-10-CM

## 2019-12-30 DIAGNOSIS — E6609 Other obesity due to excess calories: Secondary | ICD-10-CM

## 2019-12-30 DIAGNOSIS — E559 Vitamin D deficiency, unspecified: Secondary | ICD-10-CM

## 2019-12-30 DIAGNOSIS — R0602 Shortness of breath: Secondary | ICD-10-CM

## 2019-12-30 DIAGNOSIS — J45909 Unspecified asthma, uncomplicated: Secondary | ICD-10-CM

## 2019-12-30 DIAGNOSIS — Z9189 Other specified personal risk factors, not elsewhere classified: Secondary | ICD-10-CM | POA: Diagnosis not present

## 2019-12-30 DIAGNOSIS — M545 Low back pain, unspecified: Secondary | ICD-10-CM

## 2019-12-30 DIAGNOSIS — Z0289 Encounter for other administrative examinations: Secondary | ICD-10-CM

## 2019-12-30 DIAGNOSIS — Z6839 Body mass index (BMI) 39.0-39.9, adult: Secondary | ICD-10-CM

## 2019-12-30 DIAGNOSIS — R7309 Other abnormal glucose: Secondary | ICD-10-CM

## 2019-12-30 DIAGNOSIS — E7800 Pure hypercholesterolemia, unspecified: Secondary | ICD-10-CM

## 2019-12-31 ENCOUNTER — Encounter (INDEPENDENT_AMBULATORY_CARE_PROVIDER_SITE_OTHER): Payer: Self-pay | Admitting: Bariatrics

## 2019-12-31 DIAGNOSIS — E6609 Other obesity due to excess calories: Secondary | ICD-10-CM | POA: Insufficient documentation

## 2019-12-31 DIAGNOSIS — Z6839 Body mass index (BMI) 39.0-39.9, adult: Secondary | ICD-10-CM | POA: Insufficient documentation

## 2019-12-31 LAB — COMPREHENSIVE METABOLIC PANEL
ALT: 13 IU/L (ref 0–32)
AST: 15 IU/L (ref 0–40)
Albumin/Globulin Ratio: 1.4 (ref 1.2–2.2)
Albumin: 4.2 g/dL (ref 3.8–4.9)
Alkaline Phosphatase: 73 IU/L (ref 44–121)
BUN/Creatinine Ratio: 16 (ref 9–23)
BUN: 11 mg/dL (ref 6–24)
Bilirubin Total: 0.3 mg/dL (ref 0.0–1.2)
CO2: 22 mmol/L (ref 20–29)
Calcium: 9.3 mg/dL (ref 8.7–10.2)
Chloride: 106 mmol/L (ref 96–106)
Creatinine, Ser: 0.69 mg/dL (ref 0.57–1.00)
GFR calc Af Amer: 117 mL/min/{1.73_m2} (ref >59)
GFR calc non Af Amer: 101 mL/min/{1.73_m2} (ref >59)
Globulin, Total: 2.9 g/dL (ref 1.5–4.5)
Glucose: 94 mg/dL (ref 65–99)
Potassium: 5.2 mmol/L (ref 3.5–5.2)
Sodium: 143 mmol/L (ref 134–144)
Total Protein: 7.1 g/dL (ref 6.0–8.5)

## 2019-12-31 LAB — TSH: TSH: 4.74 u[IU]/mL — ABNORMAL HIGH (ref 0.450–4.500)

## 2019-12-31 LAB — LIPID PANEL WITH LDL/HDL RATIO
Cholesterol, Total: 224 mg/dL — ABNORMAL HIGH (ref 100–199)
HDL: 44 mg/dL (ref >39)
LDL Chol Calc (NIH): 165 mg/dL — ABNORMAL HIGH (ref 0–99)
LDL/HDL Ratio: 3.8 ratio — ABNORMAL HIGH (ref 0.0–3.2)
Triglycerides: 85 mg/dL (ref 0–149)
VLDL Cholesterol Cal: 15 mg/dL (ref 5–40)

## 2019-12-31 LAB — T3: T3, Total: 141 ng/dL (ref 71–180)

## 2019-12-31 LAB — T4, FREE: Free T4: 0.93 ng/dL (ref 0.82–1.77)

## 2019-12-31 LAB — HEMOGLOBIN A1C
Est. average glucose Bld gHb Est-mCnc: 114 mg/dL
Hgb A1c MFr Bld: 5.6 % (ref 4.8–5.6)

## 2019-12-31 LAB — INSULIN, RANDOM: INSULIN: 17.3 u[IU]/mL (ref 2.6–24.9)

## 2019-12-31 LAB — VITAMIN D 25 HYDROXY (VIT D DEFICIENCY, FRACTURES): Vit D, 25-Hydroxy: 33.2 ng/mL (ref 30.0–100.0)

## 2019-12-31 NOTE — Progress Notes (Signed)
Dear Dr. Freda Warren,   Thank you for referring Madeline Warren Warren to our clinic. The following note includes my evaluation and treatment recommendations.  Chief Complaint:   Madeline Warren Warren (MR# [id number]) is a 49 y.o. female who presents for evaluation and treatment of Madeline Warren and related comorbidities. Current BMI is Body mass index is 39.44 kg/m.Madeline Warren Warren has been struggling with her weight for many years and has been unsuccessful in either losing weight, maintaining weight loss, or reaching her healthy weight goal.  Madeline Warren Warren is currently in the action stage of change and ready to dedicate time achieving and maintaining a healthier weight. Madeline Warren Warren is interested in becoming our patient and working on intensive lifestyle modifications including (but not limited to) diet and exercise for weight loss.  Madeline Warren Warren does like to cook if she has time. She has some cravings in the late afternoon.  Madeline Warren Warren habits were reviewed today and are as follows: Her family eats meals together, she thinks her family will eat healthier with her, her desired weight loss is 47 lbs, she has been heavy most of her life, she started gaining weight after childbirth, her heaviest weight ever was 259 pounds, she craves sweets, she skips breakfast 3-4 times per week, she is trying to follow a vegan diet, she is frequently drinking liquids with calories, she sometimes makes poor food choices, she has binge eating behaviors and she struggles with emotional eating.  Depression Screen Madeline Warren Warren (modified PHQ-9) score was 14.  Depression screen PHQ 2/9 12/30/2019  Decreased Interest 2  Down, Depressed, Hopeless 3  PHQ - 2 Score 5  Altered sleeping 1  Tired, decreased energy 2  Change in appetite 1  Feeling bad or failure about yourself  3  Trouble concentrating 2  Moving slowly or fidgety/restless 0  Suicidal thoughts 0  PHQ-9 Score 14  Difficult doing work/chores Not difficult at all    Subjective:   Other fatigue. Madeline Warren Warren denies daytime somnolence and admits to waking up still tired. Madeline Warren Warren has a history of symptoms of Epworth sleepiness scale. Madeline Warren Warren generally gets 8 hours of sleep per night, and states that she has generally restful sleep. Snoring is present. Apneic episodes are not present. Epworth Sleepiness Score is 11.  SOB (shortness of breath) on exertion. Madeline Warren Warren notes increasing shortness of breath with exercising and seems to be worsening over time with weight gain. She notes getting out of breath sooner with activity than she used to. This has gotten worse recently. Madeline Warren denies shortness of breath at rest or orthopnea.  Vitamin D deficiency. Madeline Warren Warren is taking Vitamin D OTC.  Asthma, unspecified asthma severity, unspecified whether complicated, unspecified whether persistent. Madeline Warren Warren is taking Albuterol.  Low back pain, unspecified back pain laterality, unspecified chronicity, unspecified whether sciatica present. Madeline Warren Warren is doing yoga.  Elevated cholesterol. Madeline Warren Warren is on no medication.  Elevated glucose. Madeline Warren Warren has a positive family history of diabetes.  Depression screening. Madeline Warren Warren had a moderately positive depression screen with a PHQ-9 score of 14.  At risk for activity intolerance. Madeline Warren is at risk of exercise intolerance due to low back pain, fatigue, and Madeline Warren.  Assessment/Plan:   Other fatigue. Madeline Warren Warren does feel that her weight is causing her energy to be lower than it should be. Fatigue may be related to Madeline Warren, depression or many other causes. Labs will be ordered, and in the meanwhile, Madeline Warren Warren will focus on self care including making healthy food choices, increasing physical activity and focusing on stress reduction.  EKG 12-Lead, Comprehensive metabolic panel, T3, T4, free, TSH studies performed today.  SOB (shortness of breath) on exertion. Madeline Warren Warren does feel that she gets out of breath more easily that she used to when she exercises.  Madeline Warren Warren's shortness of breath appears to be Madeline Warren related and exercise induced. She has agreed to work on weight loss and gradually increase exercise to treat her exercise induced shortness of breath. Will continue to monitor closely.  Vitamin D deficiency. Low Vitamin D level contributes to fatigue and are associated with Madeline Warren, breast, and colon cancer. VITAMIN D 25 Hydroxy (Vit-D Deficiency, Fractures) level will be checked today.   Asthma, unspecified asthma severity, unspecified whether complicated, unspecified whether persistent. Madeline Warren Warren will use 1-2 puffs before exercise or if tightness.  Low back pain, unspecified back pain laterality, unspecified chronicity, unspecified whether sciatica present. Madeline Warren Warren will continue yoga and stretching.  Elevated cholesterol. Lipid Panel With LDL/HDL Ratio and CMP labs will be checked today.   Elevated glucose. Hemoglobin A1c, Insulin, random levels will be checked today.   Depression screening. Madeline Warren had a positive depression screening. Depression is commonly associated with Madeline Warren and often results in emotional eating behaviors. We will monitor this closely and work on CBT to help improve the non-hunger eating patterns. Referral to Psychology may be required if no improvement is seen as she continues in our clinic.  At risk for activity intolerance. Madeline Warren was given approximately 15 minutes of exercise intolerance counseling today. She is 49 y.o. female and has risk factors exercise intolerance including Madeline Warren. We discussed intensive lifestyle modifications today with an emphasis on specific weight loss instructions and strategies. Madeline Warren will slowly increase activity as tolerated.  Repetitive spaced learning was employed today to elicit superior memory formation and behavioral change.  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  Madeline Warren is currently  in the action stage of change and her goal is to continue with weight loss efforts. I recommend Dexter begin the structured treatment plan as follows:  She has agreed to the Category 2 Plan and the [place] (Vegan if tofu and beans).  She will work on meal planning, intentional eating, and decreasing her intake of sugary drinks.  Exercise goals: All adults should avoid inactivity. Some physical activity is better than none, and adults who participate in any amount of physical activity gain some health benefits.   Behavioral modification strategies: increasing lean protein intake, decreasing simple carbohydrates, increasing vegetables, increasing water intake, decreasing eating out, no skipping meals, meal planning and cooking strategies, keeping healthy foods in the home and planning for success.  She was informed of the importance of frequent follow-up visits to maximize her success with intensive lifestyle modifications for her multiple health conditions. She was informed we would discuss her lab results at her next visit unless there is a critical issue that needs to be addressed sooner. Maayan agreed to keep her next visit at the agreed upon time to discuss these results.  Objective:   Blood pressure 116/81, pulse 67, temperature 97.9 F (36.6 C), height 5\' 5"  (1.651 m), weight 237 lb (107.5 kg), last menstrual period 12/22/2013, SpO2 99 %. Body mass index is 39.44 kg/m.  EKG: Sinus  Rhythm with a rate of 68 BPM. RSR(V1) - nondiagnostic. Low voltage - possible pulmonary disease. Otherwise normal  Indirect Calorimeter completed today shows a VO2 of 238 and a REE of 1656.  Her calculated basal metabolic  rate is 1734 thus her basal metabolic rate is worse than expected.  General: Cooperative, alert, well developed, in no acute distress. HEENT: Conjunctivae and lids unremarkable. Cardiovascular: Regular rhythm.  Lungs: Normal work of breathing. Neurologic: No focal deficits.    Attestation Statements:   Reviewed by clinician on day of visit: allergies, medications, problem list, medical history, surgical history, family history, social history, and previous encounter notes.  Migdalia Dk, am acting as Location manager for [employer], DO   I have reviewed the above documentation for accuracy and completeness, and I agree with the above. Jearld Lesch, DO

## 2020-01-13 ENCOUNTER — Other Ambulatory Visit: Payer: Self-pay

## 2020-01-13 ENCOUNTER — Encounter (INDEPENDENT_AMBULATORY_CARE_PROVIDER_SITE_OTHER): Payer: Self-pay | Admitting: Bariatrics

## 2020-01-13 ENCOUNTER — Ambulatory Visit (INDEPENDENT_AMBULATORY_CARE_PROVIDER_SITE_OTHER): Payer: Commercial Managed Care - PPO | Admitting: Bariatrics

## 2020-01-13 VITALS — BP 110/70 | HR 66 | Temp 98.0°F | Ht 65.0 in | Wt 234.0 lb

## 2020-01-13 DIAGNOSIS — E7800 Pure hypercholesterolemia, unspecified: Secondary | ICD-10-CM | POA: Diagnosis not present

## 2020-01-13 DIAGNOSIS — R7989 Other specified abnormal findings of blood chemistry: Secondary | ICD-10-CM

## 2020-01-13 DIAGNOSIS — Z9189 Other specified personal risk factors, not elsewhere classified: Secondary | ICD-10-CM

## 2020-01-13 DIAGNOSIS — E559 Vitamin D deficiency, unspecified: Secondary | ICD-10-CM

## 2020-01-13 DIAGNOSIS — Z6838 Body mass index (BMI) 38.0-38.9, adult: Secondary | ICD-10-CM

## 2020-01-13 MED ORDER — VITAMIN D (ERGOCALCIFEROL) 1.25 MG (50000 UNIT) PO CAPS: 50000.0000 [IU] | ORAL_CAPSULE | ORAL | 0 refills | Status: DC

## 2020-01-13 NOTE — Progress Notes (Signed)
Chief Complaint:   OBESITY Madeline Warren is here to discuss her progress with her obesity treatment plan along with follow-up of her obesity related diagnoses. Lenna is on the Category 2 Plan and states she is following her eating plan approximately 100% of the time. Madeline Warren states she is walking 15 minutes 3 times per week.  Today's visit was #: 2 Starting weight: 237 lbs Starting date: 12/30/2019 Today's weight: 234 lbs Today's date: 01/13/2020 Total lbs lost to date: 3 Total lbs lost since last in-office visit: 3  Interim History: Madeline Warren is down 3 lbs. She has been tracking calories on "My Fitness Pal."  Subjective:   Elevated cholesterol. Coryn notes a positive family history of hypercholesterolemia. Total cholesterol was 224 with an LDL of 165 on 12/30/2019.   Ref. Range 12/30/2019 08:46  Cholesterol, Total Latest Ref Range: 100 - 199 mg/dL 224 (H)   Elevated TSH. Madeline Warren denies history of hypothyroidism. TSH 4.740 on 12/30/2019.   Ref. Range 12/30/2019 08:46  TSH Latest Ref Range: 0.450 - 4.500 uIU/mL 4.740 (H)   Vitamin D deficiency. Vitamin D level on 12/30/2019 was 33.2.   Ref. Range 12/30/2019 08:46  Vitamin D, 25-Hydroxy Latest Ref Range: 30.0 - 100.0 ng/mL 33.2   At risk for osteoporosis. Madeline Warren is at higher risk of osteopenia and osteoporosis due to Vitamin D deficiency.   Assessment/Plan:   Elevated cholesterol. Madeline Warren will increase her fiber intake, decrease carbohydrates, increase PUFA's and MUFA's, and increase activities/exercise.  Elevated TSH. Will follow-up over time.  Vitamin D deficiency. Low Vitamin D level contributes to fatigue and are associated with obesity, breast, and colon cancer. She was given a prescription for Vitamin D, Ergocalciferol, (DRISDOL) 1.25 MG (50000 UNIT) CAPS capsule every week #4 with 0 refills and will follow-up for routine testing of Vitamin D, at least 2-3 times per year to avoid over-replacement.   At risk for  osteoporosis. Madeline Warren was given approximately 15 minutes of osteoporosis prevention counseling today. Madeline Warren is at risk for osteopenia and osteoporosis due to her Vitamin D deficiency. She was encouraged to take her Vitamin D and follow her higher calcium diet and increase strengthening exercise to help strengthen her bones and decrease her risk of osteopenia and osteoporosis.  Repetitive spaced learning was employed today to elicit superior memory formation and behavioral change.  Class 2 severe obesity with serious comorbidity and body mass index (BMI) of 38.0 to 38.9 in adult, unspecified obesity type ([place]).  Madeline Warren is currently in the action stage of change. As such, her goal is to continue with weight loss efforts. She has agreed to keeping a food journal and adhering to recommended goals of 1200 calories and 90 grams of protein ("Vegan").   She will work on meal planning and increase fiber and vegetables.  We reviewed with the patient labs including CMP, lipids, A1c, insulin, and thyroid panel.  Handout was provided on Protein Equivalents.  Exercise goals: Madeline Warren will continue walking at work for exercise.  Behavioral modification strategies: increasing lean protein intake, decreasing simple carbohydrates, increasing vegetables, increasing water intake, decreasing eating out, no skipping meals, meal planning and cooking strategies, keeping healthy foods in the home and planning for success.  Madeline Warren has agreed to follow-up with our clinic in 2 weeks. She was informed of the importance of frequent follow-up visits to maximize her success with intensive lifestyle modifications for her multiple health conditions.   Objective:   Blood pressure 110/70, pulse 66, temperature 98 F (36.7  C), height 5\' 5"  (1.651 m), weight 234 lb (106.1 kg), last menstrual period 12/22/2013, SpO2 98 %. Body mass index is 38.94 kg/m.  General: Cooperative, alert, well developed, in no acute  distress. HEENT: Conjunctivae and lids unremarkable. Cardiovascular: Regular rhythm.  Lungs: Normal work of breathing. Neurologic: No focal deficits.   Lab Results  Component Value Date   CREATININE 0.69 12/30/2019   BUN 11 12/30/2019   NA 143 12/30/2019   K 5.2 12/30/2019   CL 106 12/30/2019   CO2 22 12/30/2019   Lab Results  Component Value Date   ALT 13 12/30/2019   AST 15 12/30/2019   ALKPHOS 73 12/30/2019   BILITOT 0.3 12/30/2019   Lab Results  Component Value Date   HGBA1C 5.6 12/30/2019   Lab Results  Component Value Date   INSULIN 17.3 12/30/2019   Lab Results  Component Value Date   TSH 4.740 (H) 12/30/2019   Lab Results  Component Value Date   CHOL 224 (H) 12/30/2019   HDL 44 12/30/2019   LDLCALC 165 (H) 12/30/2019   TRIG 85 12/30/2019   Lab Results  Component Value Date   WBC 10.6 (H) 01/07/2014   HGB 10.5 (L) 01/07/2014   HCT 33.5 (L) 01/07/2014   MCV 80.3 01/07/2014   PLT 287 01/07/2014   No results found for: IRON, TIBC, FERRITIN  Attestation Statements:   Reviewed by clinician on day of visit: allergies, medications, problem list, medical history, surgical history, family history, social history, and previous encounter notes.  Migdalia Dk, am acting as Location manager for [employer], DO   I have reviewed the above documentation for accuracy and completeness, and I agree with the above. Jearld Lesch, DO

## 2020-01-28 ENCOUNTER — Other Ambulatory Visit: Payer: Self-pay

## 2020-01-28 ENCOUNTER — Ambulatory Visit (INDEPENDENT_AMBULATORY_CARE_PROVIDER_SITE_OTHER): Payer: Commercial Managed Care - PPO | Admitting: Bariatrics

## 2020-01-28 ENCOUNTER — Encounter (INDEPENDENT_AMBULATORY_CARE_PROVIDER_SITE_OTHER): Payer: Self-pay | Admitting: Bariatrics

## 2020-01-28 VITALS — BP 125/78 | HR 78 | Temp 97.8°F | Ht 65.0 in | Wt 231.0 lb

## 2020-01-28 DIAGNOSIS — E7800 Pure hypercholesterolemia, unspecified: Secondary | ICD-10-CM | POA: Diagnosis not present

## 2020-01-28 DIAGNOSIS — Z6838 Body mass index (BMI) 38.0-38.9, adult: Secondary | ICD-10-CM

## 2020-01-28 DIAGNOSIS — E559 Vitamin D deficiency, unspecified: Secondary | ICD-10-CM | POA: Diagnosis not present

## 2020-01-28 MED ORDER — VITAMIN D (ERGOCALCIFEROL) 1.25 MG (50000 UNIT) PO CAPS: 50000.0000 [IU] | ORAL_CAPSULE | ORAL | 0 refills | Status: DC

## 2020-02-02 ENCOUNTER — Encounter (INDEPENDENT_AMBULATORY_CARE_PROVIDER_SITE_OTHER): Payer: Self-pay | Admitting: Bariatrics

## 2020-02-02 NOTE — Progress Notes (Signed)
Chief Complaint:   Madeline Warren is here to discuss her progress with her Madeline treatment plan along with follow-up of her Madeline related diagnoses. Madeline Warren is keeping a food journal and adhering to recommended goals of 1200 calories and 85 grams of protein and states she is following her eating plan approximately 85% of the time. Madeline Warren states she is walking 15-20 minutes 3 times per week.  Today's visit was #: 3 Starting weight: 237 lbs Starting date: 12/30/2019 Today's weight: 231 lbs Today's date: 01/28/2020 Total lbs lost to date: 6 Total lbs lost since last in-office visit: 3  Interim History: Madeline Warren is down 3 lbs since her last visit. She reports doing well with her water intake.  Subjective:   Vitamin D deficiency. Madeline Warren is taking high dose Vitamin D supplementation.    Ref. Range 12/30/2019 08:46  Vitamin D, 25-Hydroxy Latest Ref Range: 30.0 - 100.0 ng/mL 33.2   Elevated cholesterol. Madeline Warren is on no medication.   Ref. Range 12/30/2019 08:46  Cholesterol, Total Latest Ref Range: 100 - 199 mg/dL 224 (H)   Assessment/Plan:   Vitamin D deficiency. Low Vitamin D level contributes to fatigue and are associated with Madeline, breast, and colon cancer. She was given a prescription for Vitamin D, Ergocalciferol, (DRISDOL) 1.25 MG (50000 UNIT) CAPS capsule every week #4 with 0 refills and will follow-up for routine testing of Vitamin D, at least 2-3 times per year to avoid over-replacement.   Elevated cholesterol. Madeline Warren will increase activity and increase PUFA's and MUFA's.  Class 2 severe Madeline with serious comorbidity and body mass index (BMI) of 38.0 to 38.9 in adult, unspecified Madeline type (Madeline Warren).  Madeline Warren is currently in the action stage of change. As such, her goal is to continue with weight loss efforts. She has agreed to keeping a food journal and adhering to recommended goals of 1200 calories and 85 grams of protein.   She will work on meal  planning, intentional eating, and increasing her water intake to 64 oz daily.  Thanksgiving strategies were discussed today.  Exercise goals: All adults should avoid inactivity. Some physical activity is better than none, and adults who participate in any amount of physical activity gain some health benefits.  Behavioral modification strategies: increasing lean protein intake, decreasing simple carbohydrates, increasing vegetables, increasing water intake, decreasing eating out, no skipping meals, meal planning and cooking strategies, keeping healthy foods in the home, travel eating strategies, holiday eating strategies  and celebration eating strategies.  Madeline Warren has agreed to follow-up with our clinic in 2 weeks. She was informed of the importance of frequent follow-up visits to maximize her success with intensive lifestyle modifications for her multiple health conditions.   Objective:   Blood pressure 125/78, pulse 78, temperature 97.8 F (36.6 C), height 5\' 5"  (1.651 m), weight 231 lb (104.8 kg), last menstrual period 12/22/2013, SpO2 97 %. Body mass index is 38.44 kg/m.  General: Cooperative, alert, well developed, in no acute distress. HEENT: Conjunctivae and lids unremarkable. Cardiovascular: Regular rhythm.  Lungs: Normal work of breathing. Neurologic: No focal deficits.   Lab Results  Component Value Date   CREATININE 0.69 12/30/2019   BUN 11 12/30/2019   NA 143 12/30/2019   K 5.2 12/30/2019   CL 106 12/30/2019   CO2 22 12/30/2019   Lab Results  Component Value Date   ALT 13 12/30/2019   AST 15 12/30/2019   ALKPHOS 73 12/30/2019   BILITOT 0.3 12/30/2019   Lab Results  Component Value Date   HGBA1C 5.6 12/30/2019   Lab Results  Component Value Date   INSULIN 17.3 12/30/2019   Lab Results  Component Value Date   TSH 4.740 (H) 12/30/2019   Lab Results  Component Value Date   CHOL 224 (H) 12/30/2019   HDL 44 12/30/2019   LDLCALC 165 (H) 12/30/2019   TRIG  85 12/30/2019   Lab Results  Component Value Date   WBC 10.6 (H) 01/07/2014   HGB 10.5 (L) 01/07/2014   HCT 33.5 (L) 01/07/2014   MCV 80.3 01/07/2014   PLT 287 01/07/2014   No results found for: IRON, TIBC, FERRITIN  Attestation Statements:   Reviewed by clinician on day of visit: allergies, medications, problem list, medical history, surgical history, family history, social history, and previous encounter notes.  Time spent on visit including pre-visit chart review and post-visit charting and care was 20 minutes.   Migdalia Dk, am acting as Location manager for [employer], DO   I have reviewed the above documentation for accuracy and completeness, and I agree with the above. Jearld Lesch, DO

## 2020-02-10 ENCOUNTER — Ambulatory Visit (INDEPENDENT_AMBULATORY_CARE_PROVIDER_SITE_OTHER): Payer: Commercial Managed Care - PPO | Admitting: Bariatrics

## 2020-02-10 ENCOUNTER — Other Ambulatory Visit: Payer: Self-pay

## 2020-02-10 ENCOUNTER — Encounter (INDEPENDENT_AMBULATORY_CARE_PROVIDER_SITE_OTHER): Payer: Self-pay | Admitting: Bariatrics

## 2020-02-10 VITALS — BP 132/90 | HR 60 | Temp 98.0°F | Ht 65.0 in | Wt 227.0 lb

## 2020-02-10 DIAGNOSIS — E7800 Pure hypercholesterolemia, unspecified: Secondary | ICD-10-CM

## 2020-02-10 DIAGNOSIS — Z6837 Body mass index (BMI) 37.0-37.9, adult: Secondary | ICD-10-CM | POA: Diagnosis not present

## 2020-02-10 DIAGNOSIS — E559 Vitamin D deficiency, unspecified: Secondary | ICD-10-CM | POA: Diagnosis not present

## 2020-02-10 NOTE — Progress Notes (Signed)
Chief Complaint:   Madeline Warren is here to discuss her progress with her Madeline treatment plan along with follow-up of her Madeline related diagnoses. Madeline Warren is keeping a food journal and adhering to recommended goals of 1200 calories and 85 grams of protein and states she is following her eating plan approximately 75% of the time. Madeline Warren states she is walking 15 minutes 3 times per week.  Today's visit was #: 4 Starting weight: 237 lbs Starting date: 12/30/2019 Today's weight: 227 lbs Today's date: 02/10/2020 Total lbs lost to date: 10 Total lbs lost since last in-office visit: 4  Interim History: Madeline Warren is down 4 lbs and has done well overall. She did not struggle over the Thanksgiving holiday.  Subjective:   Elevated cholesterol.v Madeline Warren is on no medication.   Ref. Range 12/30/2019 08:46  Cholesterol, Total Latest Ref Range: 100 - 199 mg/dL 224 (H)   Vitamin D deficiency. Madeline Warren is taking Vitamin D supplementation.    Ref. Range 12/30/2019 08:46  Vitamin D, 25-Hydroxy Latest Ref Range: 30.0 - 100.0 ng/mL 33.2   Assessment/Plan:   Elevated cholesterol. Madeline Warren will avoid trans fats, limit saturated fats, and increase MUFA's and PUFA's.  Vitamin D deficiency. Low Vitamin D level contributes to fatigue and are associated with Madeline, breast, and colon cancer. She agrees to continue to take Vitamin D as directed and will follow-up for routine testing of Vitamin D, at least 2-3 times per year to avoid over-replacement.  Class 2 severe Madeline with serious comorbidity and body mass index (BMI) of 37.0 to 37.9 in adult, unspecified Madeline type (Madeline Warren).  Madeline Warren is currently in the action stage of change. As such, her goal is to continue with weight loss efforts. She has agreed to keeping a food journal and adhering to recommended goals of 1200 calories and 85 grams of protein.   She will adhere closely to the plan, work on meal planning, and increase her protein  intake.  Exercise goals: Madeline Warren will continue walking and going to [employer].  Behavioral modification strategies: increasing lean protein intake, decreasing simple carbohydrates, increasing vegetables, increasing water intake, decreasing eating out, no skipping meals, meal planning and cooking strategies, keeping healthy foods in the home and planning for success.  Madeline Warren has agreed to follow-up with our clinic in 2-3 weeks. She was informed of the importance of frequent follow-up visits to maximize her success with intensive lifestyle modifications for her multiple health conditions.   Objective:   Blood pressure 132/90, pulse 60, temperature 98 F (36.7 C), height 5\' 5"  (1.651 m), weight 227 lb (103 kg), last menstrual period 12/22/2013, SpO2 100 %. Body mass index is 37.77 kg/m.  General: Cooperative, alert, well developed, in no acute distress. HEENT: Conjunctivae and lids unremarkable. Cardiovascular: Regular rhythm.  Lungs: Normal work of breathing. Neurologic: No focal deficits.   Lab Results  Component Value Date   CREATININE 0.69 12/30/2019   BUN 11 12/30/2019   NA 143 12/30/2019   K 5.2 12/30/2019   CL 106 12/30/2019   CO2 22 12/30/2019   Lab Results  Component Value Date   ALT 13 12/30/2019   AST 15 12/30/2019   ALKPHOS 73 12/30/2019   BILITOT 0.3 12/30/2019   Lab Results  Component Value Date   HGBA1C 5.6 12/30/2019   Lab Results  Component Value Date   INSULIN 17.3 12/30/2019   Lab Results  Component Value Date   TSH 4.740 (H) 12/30/2019   Lab Results  Component Value Date   CHOL 224 (H) 12/30/2019   HDL 44 12/30/2019   LDLCALC 165 (H) 12/30/2019   TRIG 85 12/30/2019   Lab Results  Component Value Date   WBC 10.6 (H) 01/07/2014   HGB 10.5 (L) 01/07/2014   HCT 33.5 (L) 01/07/2014   MCV 80.3 01/07/2014   PLT 287 01/07/2014   No results found for: IRON, TIBC, FERRITIN  Attestation Statements:   Reviewed by clinician on day of  visit: allergies, medications, problem list, medical history, surgical history, family history, social history, and previous encounter notes.  Time spent on visit including pre-visit chart review and post-visit charting and care was 20 minutes.   Migdalia Dk, am acting as Location manager for [employer], DO   I have reviewed the above documentation for accuracy and completeness, and I agree with the above. Jearld Lesch, DO

## 2020-02-11 ENCOUNTER — Encounter (INDEPENDENT_AMBULATORY_CARE_PROVIDER_SITE_OTHER): Payer: Self-pay | Admitting: Bariatrics

## 2020-03-01 ENCOUNTER — Encounter (INDEPENDENT_AMBULATORY_CARE_PROVIDER_SITE_OTHER): Payer: Self-pay | Admitting: Bariatrics

## 2020-03-01 ENCOUNTER — Other Ambulatory Visit (INDEPENDENT_AMBULATORY_CARE_PROVIDER_SITE_OTHER): Payer: Self-pay | Admitting: Bariatrics

## 2020-03-01 ENCOUNTER — Ambulatory Visit (INDEPENDENT_AMBULATORY_CARE_PROVIDER_SITE_OTHER): Payer: Commercial Managed Care - PPO | Admitting: Bariatrics

## 2020-03-01 ENCOUNTER — Other Ambulatory Visit: Payer: Self-pay

## 2020-03-01 VITALS — BP 100/69 | HR 69 | Temp 98.0°F | Ht 65.0 in | Wt 223.0 lb

## 2020-03-01 DIAGNOSIS — E559 Vitamin D deficiency, unspecified: Secondary | ICD-10-CM

## 2020-03-01 DIAGNOSIS — G8929 Other chronic pain: Secondary | ICD-10-CM | POA: Diagnosis not present

## 2020-03-01 DIAGNOSIS — M25562 Pain in left knee: Secondary | ICD-10-CM | POA: Diagnosis not present

## 2020-03-01 DIAGNOSIS — Z9189 Other specified personal risk factors, not elsewhere classified: Secondary | ICD-10-CM

## 2020-03-01 DIAGNOSIS — Z6837 Body mass index (BMI) 37.0-37.9, adult: Secondary | ICD-10-CM

## 2020-03-01 MED ORDER — VITAMIN D (ERGOCALCIFEROL) 1.25 MG (50000 UNIT) PO CAPS: 50000.0000 [IU] | ORAL_CAPSULE | ORAL | 0 refills | Status: DC

## 2020-03-01 NOTE — Telephone Encounter (Signed)
This patient was last seen by Dr. Owens Shark, and currently has an appt scheduled with her today.

## 2020-03-01 NOTE — Progress Notes (Signed)
Chief Complaint:   OBESITY Madeline Warren is here to discuss her progress with her obesity treatment plan along with follow-up of her obesity related diagnoses. Madeline Warren is keeping a food journal and adhering to recommended goals of 1200 calories and 85 grams of protein and states she is following her eating plan approximately 75% of the time. Madeline Warren states she is exercising 0 minutes 0 times per week.  Today's visit was #: 5 Starting weight: 237 lbs Starting date: 12/30/2019 Today's weight: 223 lbs Today's date: 03/01/2020 Total lbs lost to date: 14 Total lbs lost since last in-office visit: 4  Interim History: Madeline Warren is down 4 lbs. She is studying for a test for work.  Subjective:   Vitamin D deficiency. Madeline Warren denies sunlight exposure.   Ref. Range 12/30/2019 08:46  Vitamin D, 25-Hydroxy Latest Ref Range: 30.0 - 100.0 ng/mL 33.2   Chronic pain of left knee. Madeline Warren reports occasional lower back pain from sitting. She denies current pain.  At risk for osteoporosis. Madeline Warren is at higher risk of osteopenia and osteoporosis due to Vitamin D deficiency.   Assessment/Plan:   Vitamin D deficiency. Low Vitamin D level contributes to fatigue and are associated with obesity, breast, and colon cancer. She was given a prescription for Vitamin D, Ergocalciferol, (DRISDOL) 1.25 MG (50000 UNIT) CAPS capsule every week #4 with 0 refills and will follow-up for routine testing of Vitamin D, at least 2-3 times per year to avoid over-replacement.   Chronic pain of left knee. Madeline Warren was instructed to avoid pounding exercises.  At risk for osteoporosis. Madeline Warren was given approximately 15 minutes of osteoporosis prevention counseling today. Madeline Warren is at risk for osteopenia and osteoporosis due to her Vitamin D deficiency. She was encouraged to take her Vitamin D and follow her higher calcium diet and increase strengthening exercise to help strengthen her bones and decrease her risk of  osteopenia and osteoporosis.  Repetitive spaced learning was employed today to elicit superior memory formation and behavioral change.  Class 2 severe obesity with serious comorbidity and body mass index (BMI) of 37.0 to 37.9 in adult, unspecified obesity type ([place]).  Madeline Warren is currently in the action stage of change. As such, her goal is to continue with weight loss efforts. She has agreed to keeping a food journal and adhering to recommended goals of 1200 calories and 85 grams of protein.    She will work on meal planning and intentional eating.   Exercise goals: All adults should avoid inactivity. Some physical activity is better than none, and adults who participate in any amount of physical activity gain some health benefits.  Behavioral modification strategies: increasing lean protein intake, decreasing simple carbohydrates, increasing vegetables, increasing water intake, decreasing eating out, no skipping meals, meal planning and cooking strategies, keeping healthy foods in the home, dealing with family or coworker sabotage, travel eating strategies, holiday eating strategies , celebration eating strategies and avoiding temptations.  Madeline Warren has agreed to follow-up with our clinic in 2-3 weeks. She was informed of the importance of frequent follow-up visits to maximize her success with intensive lifestyle modifications for her multiple health conditions.   Objective:   Blood pressure 100/69, pulse 69, temperature 98 F (36.7 C), temperature source Oral, height 5\' 5"  (1.651 m), weight 223 lb (101.2 kg), last menstrual period 12/22/2013, SpO2 97 %. Body mass index is 37.11 kg/m.  General: Cooperative, alert, well developed, in no acute distress. HEENT: Conjunctivae and lids unremarkable. Cardiovascular: Regular rhythm.  Lungs:  Normal work of breathing. Neurologic: No focal deficits.   Lab Results  Component Value Date   CREATININE 0.69 12/30/2019   BUN 11 12/30/2019   NA 143  12/30/2019   K 5.2 12/30/2019   CL 106 12/30/2019   CO2 22 12/30/2019   Lab Results  Component Value Date   ALT 13 12/30/2019   AST 15 12/30/2019   ALKPHOS 73 12/30/2019   BILITOT 0.3 12/30/2019   Lab Results  Component Value Date   HGBA1C 5.6 12/30/2019   Lab Results  Component Value Date   INSULIN 17.3 12/30/2019   Lab Results  Component Value Date   TSH 4.740 (H) 12/30/2019   Lab Results  Component Value Date   CHOL 224 (H) 12/30/2019   HDL 44 12/30/2019   LDLCALC 165 (H) 12/30/2019   TRIG 85 12/30/2019   Lab Results  Component Value Date   WBC 10.6 (H) 01/07/2014   HGB 10.5 (L) 01/07/2014   HCT 33.5 (L) 01/07/2014   MCV 80.3 01/07/2014   PLT 287 01/07/2014   No results found for: IRON, TIBC, FERRITIN  Attestation Statements:   Reviewed by clinician on day of visit: allergies, medications, problem list, medical history, surgical history, family history, social history, and previous encounter notes.  Migdalia Dk, am acting as Location manager for [employer], DO   I have reviewed the above documentation for accuracy and completeness, and I agree with the above. Jearld Lesch, DO

## 2020-03-06 ENCOUNTER — Encounter (INDEPENDENT_AMBULATORY_CARE_PROVIDER_SITE_OTHER): Payer: Self-pay | Admitting: Bariatrics

## 2020-03-09 ENCOUNTER — Encounter (INDEPENDENT_AMBULATORY_CARE_PROVIDER_SITE_OTHER): Payer: Self-pay | Admitting: Bariatrics

## 2020-03-15 ENCOUNTER — Encounter (INDEPENDENT_AMBULATORY_CARE_PROVIDER_SITE_OTHER): Payer: Self-pay | Admitting: Bariatrics

## 2020-03-15 ENCOUNTER — Other Ambulatory Visit: Payer: Self-pay

## 2020-03-15 ENCOUNTER — Ambulatory Visit (INDEPENDENT_AMBULATORY_CARE_PROVIDER_SITE_OTHER): Payer: Commercial Managed Care - PPO | Admitting: Bariatrics

## 2020-03-15 VITALS — BP 102/69 | HR 71 | Temp 98.1°F | Ht 65.0 in | Wt 223.0 lb

## 2020-03-15 DIAGNOSIS — Z6837 Body mass index (BMI) 37.0-37.9, adult: Secondary | ICD-10-CM

## 2020-03-15 DIAGNOSIS — E559 Vitamin D deficiency, unspecified: Secondary | ICD-10-CM | POA: Diagnosis not present

## 2020-03-15 NOTE — Telephone Encounter (Signed)
Please review

## 2020-03-16 ENCOUNTER — Encounter (INDEPENDENT_AMBULATORY_CARE_PROVIDER_SITE_OTHER): Payer: Self-pay | Admitting: Bariatrics

## 2020-03-16 NOTE — Progress Notes (Signed)
Chief Complaint:   OBESITY Madeline Warren is here to discuss her progress with her obesity treatment plan along with follow-up of her obesity related diagnoses. Dott is on keeping a food journal and adhering to recommended goals of 1200 calories and 80-90 grams of protein and states she is following her eating plan approximately 60% of the time. [name] states she is not exercising at this time.  Today's visit was #: 6 Starting weight: 237 lbs Starting date: 12/30/2019 Today's weight: 223 lbs Today's date: 03/15/2020 Total lbs lost to date: 14 lbs Total lbs lost since last in-office visit: 0  Interim History: Madeline Warren's weight has remained the same.  She did not use her application to track her food.  She has not been drinking adequate water.  Subjective:   1. Vitamin D deficiency Evoleht's Vitamin D level was 33.2 on 12/30/2019. She is currently taking prescription vitamin D 50,000 IU each week. She denies nausea, vomiting or muscle weakness.    Assessment/Plan:   1. Vitamin D deficiency Low Vitamin D level contributes to fatigue and are associated with obesity, breast, and colon cancer. She agrees to continue to take prescription Vitamin D @50 ,000 IU every week and will follow-up for routine testing of Vitamin D, at least 2-3 times per year to avoid over-replacement.  2. Class 2 severe obesity with serious comorbidity and body mass index (BMI) of 37.0 to 37.9 in adult, unspecified obesity type Madeline Warren)  [name] is currently in the action stage of change. As such, her goal is to continue with weight loss efforts. She has agreed to keeping a food journal and adhering to recommended goals of 1200 calories and 80-90 grams of protein.   She will work on meal planning, intentional eating, and increasing vegetable, fruits, and protein intake.  Exercise goals: All adults should avoid inactivity. Some physical activity is better than none, and adults who participate in any amount of physical  activity gain some health benefits.  Behavioral modification strategies: increasing lean protein intake, decreasing simple carbohydrates, increasing vegetables, increasing water intake, decreasing eating out, no skipping meals, meal planning and cooking strategies, keeping healthy foods in the home and planning for success.  Madeline Warren has agreed to follow-up with our clinic in 2-3 weeks. She was informed of the importance of frequent follow-up visits to maximize her success with intensive lifestyle modifications for her multiple health conditions.   Objective:   Blood pressure 102/69, pulse 71, temperature 98.1 F (36.7 C), temperature source Oral, height 5\' 5"  (1.651 m), weight 223 lb (101.2 kg), last menstrual period 12/22/2013, SpO2 98 %. Body mass index is 37.11 kg/m.  General: Cooperative, alert, well developed, in no acute distress. HEENT: Conjunctivae and lids unremarkable. Cardiovascular: Regular rhythm.  Lungs: Normal work of breathing. Neurologic: No focal deficits.   Lab Results  Component Value Date   CREATININE 0.69 12/30/2019   BUN 11 12/30/2019   NA 143 12/30/2019   K 5.2 12/30/2019   CL 106 12/30/2019   CO2 22 12/30/2019   Lab Results  Component Value Date   ALT 13 12/30/2019   AST 15 12/30/2019   ALKPHOS 73 12/30/2019   BILITOT 0.3 12/30/2019   Lab Results  Component Value Date   HGBA1C 5.6 12/30/2019   Lab Results  Component Value Date   INSULIN 17.3 12/30/2019   Lab Results  Component Value Date   TSH 4.740 (H) 12/30/2019   Lab Results  Component Value Date   CHOL 224 (H) 12/30/2019  HDL 44 12/30/2019   LDLCALC 165 (H) 12/30/2019   TRIG 85 12/30/2019   Lab Results  Component Value Date   WBC 10.6 (H) 01/07/2014   HGB 10.5 (L) 01/07/2014   HCT 33.5 (L) 01/07/2014   MCV 80.3 01/07/2014   PLT 287 01/07/2014   Attestation Statements:   Reviewed by clinician on day of visit: allergies, medications, problem list, medical history, surgical  history, family history, social history, and previous encounter notes.  I, Insurance claims handler, CMA, am acting as Energy manager for [employer], DO  I have reviewed the above documentation for accuracy and completeness, and I agree with the above. Corinna Capra, DO

## 2020-03-23 ENCOUNTER — Other Ambulatory Visit (INDEPENDENT_AMBULATORY_CARE_PROVIDER_SITE_OTHER): Payer: Self-pay | Admitting: Bariatrics

## 2020-03-23 DIAGNOSIS — E559 Vitamin D deficiency, unspecified: Secondary | ICD-10-CM

## 2020-03-23 NOTE — Telephone Encounter (Signed)
Dr.Brown 

## 2020-03-29 ENCOUNTER — Encounter (INDEPENDENT_AMBULATORY_CARE_PROVIDER_SITE_OTHER): Payer: Self-pay | Admitting: Bariatrics

## 2020-03-29 ENCOUNTER — Telehealth (INDEPENDENT_AMBULATORY_CARE_PROVIDER_SITE_OTHER): Payer: Commercial Managed Care - PPO | Admitting: Bariatrics

## 2020-03-29 DIAGNOSIS — Z6837 Body mass index (BMI) 37.0-37.9, adult: Secondary | ICD-10-CM

## 2020-03-29 DIAGNOSIS — E559 Vitamin D deficiency, unspecified: Secondary | ICD-10-CM | POA: Diagnosis not present

## 2020-03-29 DIAGNOSIS — M25562 Pain in left knee: Secondary | ICD-10-CM

## 2020-03-29 DIAGNOSIS — G8929 Other chronic pain: Secondary | ICD-10-CM | POA: Diagnosis not present

## 2020-03-29 MED ORDER — VITAMIN D (ERGOCALCIFEROL) 1.25 MG (50000 UNIT) PO CAPS: 50000.0000 [IU] | ORAL_CAPSULE | ORAL | 0 refills | Status: DC

## 2020-03-30 NOTE — Progress Notes (Signed)
TeleHealth Visit:  Due to the COVID-19 pandemic, this visit was completed with telemedicine (audio/video) technology to reduce patient and provider exposure as well as to preserve personal protective equipment.   Madeline Warren has verbally consented to this TeleHealth visit. The patient is located at home, the provider is located at the [facility] office. The participants in this visit include the listed provider and patient. The visit was conducted today via MyChart video.  Chief Complaint: OBESITY Madeline Warren is here to discuss her progress with her obesity treatment plan along with follow-up of her obesity related diagnoses. Madeline Warren is on keeping a food journal and adhering to recommended goals of 1200 calories and 80-90 grams of protein and states she is following her eating plan approximately 65% of the time. Madeline Warren states she is not exercising at this time.  Today's visit was #: 7 Starting weight: 237 lbs Starting date: 12/30/2019  Interim History: Madeline Warren states that she has lost about 6 pounds and is doing well overall.  She is back to drinking adequate water.  Subjective:   1. Vitamin D deficiency Madeline Warren's Vitamin D level was 33.2 on 12/30/2019. She is currently taking prescription vitamin D 50,000 IU each week. She denies nausea, vomiting or muscle weakness.  Minimal sun exposure.  2. Chronic pain of left knee Occasional back pain but doing well otherwise.  Assessment/Plan:   1. Vitamin D deficiency Low Vitamin D level contributes to fatigue and are associated with obesity, breast, and colon cancer. She agrees to continue to take prescription Vitamin D @50 ,000 IU every week and will follow-up for routine testing of Vitamin D, at least 2-3 times per year to avoid over-replacement.  -Refill Vitamin D, Ergocalciferol, (DRISDOL) 1.25 MG (50000 UNIT) CAPS capsule; Take 1 capsule (50,000 Units total) by mouth every 7 (seven) days.  Dispense: 4 capsule; Refill: 0  2.  Chronic pain of left knee Gradually increase exercise.  No pounding exercise.  Wear a sleeve or brace if needed.  3. Class 2 severe obesity with serious comorbidity and body mass index (BMI) of 37.0 to 37.9 in adult, unspecified obesity type Madeline Warren)  Madeline Warren is currently in the action stage of change. As such, her goal is to continue with weight loss efforts. She has agreed to keeping a food journal and adhering to recommended goals of 1200 calories and 80-90 grams of protein.   She will work on meal planning and mindful eating.  Exercise goals: Exercise bike and will go slow.  Behavioral modification strategies: increasing lean protein intake, decreasing simple carbohydrates, increasing vegetables, increasing water intake, decreasing eating out, no skipping meals, meal planning and cooking strategies, keeping healthy foods in the home and planning for success.  Madeline Warren has agreed to follow-up with our clinic on 04/12/2020. She was informed of the importance of frequent follow-up visits to maximize her success with intensive lifestyle modifications for her multiple health conditions.  Objective:   VITALS: Per patient if applicable, see vitals. GENERAL: Alert and in no acute distress. CARDIOPULMONARY: No increased WOB. Speaking in clear sentences.  PSYCH: Pleasant and cooperative. Speech normal rate and rhythm. Affect is appropriate. Insight and judgement are appropriate. Attention is focused, linear, and appropriate.  NEURO: Oriented as arrived to appointment on time with no prompting.   Lab Results  Component Value Date   CREATININE 0.69 12/30/2019   BUN 11 12/30/2019   NA 143 12/30/2019   K 5.2 12/30/2019   CL 106 12/30/2019   CO2 22 12/30/2019  Lab Results  Component Value Date   ALT 13 12/30/2019   AST 15 12/30/2019   ALKPHOS 73 12/30/2019   BILITOT 0.3 12/30/2019   Lab Results  Component Value Date   HGBA1C 5.6 12/30/2019   Lab Results  Component Value Date   INSULIN  17.3 12/30/2019   Lab Results  Component Value Date   TSH 4.740 (H) 12/30/2019   Lab Results  Component Value Date   CHOL 224 (H) 12/30/2019   HDL 44 12/30/2019   LDLCALC 165 (H) 12/30/2019   TRIG 85 12/30/2019   Lab Results  Component Value Date   WBC 10.6 (H) 01/07/2014   HGB 10.5 (L) 01/07/2014   HCT 33.5 (L) 01/07/2014   MCV 80.3 01/07/2014   PLT 287 01/07/2014   Attestation Statements:   Reviewed by clinician on day of visit: allergies, medications, problem list, medical history, surgical history, family history, social history, and previous encounter notes.  I, Water quality scientist, CMA, am acting as Location manager for [employer], DO  I have reviewed the above documentation for accuracy and completeness, and I agree with the above. Jearld Lesch, DO

## 2020-03-31 ENCOUNTER — Encounter (INDEPENDENT_AMBULATORY_CARE_PROVIDER_SITE_OTHER): Payer: Self-pay | Admitting: Bariatrics

## 2020-04-12 ENCOUNTER — Encounter (INDEPENDENT_AMBULATORY_CARE_PROVIDER_SITE_OTHER): Payer: Self-pay | Admitting: Bariatrics

## 2020-04-12 ENCOUNTER — Other Ambulatory Visit: Payer: Self-pay

## 2020-04-12 ENCOUNTER — Ambulatory Visit (INDEPENDENT_AMBULATORY_CARE_PROVIDER_SITE_OTHER): Payer: Commercial Managed Care - PPO | Admitting: Bariatrics

## 2020-04-12 VITALS — BP 111/83 | HR 66 | Temp 98.2°F | Ht 65.0 in | Wt 223.0 lb

## 2020-04-12 DIAGNOSIS — M25562 Pain in left knee: Secondary | ICD-10-CM

## 2020-04-12 DIAGNOSIS — E7800 Pure hypercholesterolemia, unspecified: Secondary | ICD-10-CM | POA: Diagnosis not present

## 2020-04-12 DIAGNOSIS — Z6837 Body mass index (BMI) 37.0-37.9, adult: Secondary | ICD-10-CM

## 2020-04-12 DIAGNOSIS — G8929 Other chronic pain: Secondary | ICD-10-CM

## 2020-04-12 NOTE — Progress Notes (Signed)
Chief Complaint:   OBESITY Madeline Warren is here to discuss her progress with her obesity treatment plan along with follow-up of her obesity related diagnoses. Madeline Warren is on keeping a food journal and adhering to recommended goals of 1200 calories and 80-90 grams of protein and states she is following her eating plan approximately 65% of the time. Madeline Warren states she is biking for 15 minutes 1-2 times per week.  Today's visit was #: 8 Starting weight: 237 lbs Starting date: 12/30/2019 Today's weight: 223 lbs Today's date: 04/12/2020 Total lbs lost to date: 14 lbs Total lbs lost since last in-office visit: 0  Interim History: Madeline Warren's weight remains the same.  She is doing okay with her water.  Subjective:   1. Chronic pain of left knee Minimal issues.    2. Elevated cholesterol Madeline Warren has hyperlipidemia and has been trying to improve her cholesterol levels with intensive lifestyle modification including a low saturated fat diet, exercise and weight loss. She denies any chest pain, claudication or myalgias.  No medications.  Lab Results  Component Value Date   ALT 13 12/30/2019   AST 15 12/30/2019   ALKPHOS 73 12/30/2019   BILITOT 0.3 12/30/2019   Lab Results  Component Value Date   CHOL 224 (H) 12/30/2019   HDL 44 12/30/2019   LDLCALC 165 (H) 12/30/2019   TRIG 85 12/30/2019   Assessment/Plan:   1. Chronic pain of left knee No pounding exercise.  2. Elevated cholesterol Cardiovascular risk and specific lipid/LDL goals reviewed.  We discussed several lifestyle modifications today and Madeline Warren will continue to work on diet, exercise and weight loss efforts. Orders and follow up as documented in patient record.  Eliminate trans fats and increase PUFAs and MUFAs.  Counseling Intensive lifestyle modifications are the first line treatment for this issue. . Dietary changes: Increase soluble fiber. Decrease simple carbohydrates. . Exercise changes: Moderate to  vigorous-intensity aerobic activity 150 minutes per week if tolerated. . Lipid-lowering medications: see documented in medical record.  3. Class 2 severe obesity with serious comorbidity and body mass index (BMI) of 37.0 to 37.9 in adult, unspecified obesity type Madeline Warren)  Madeline Warren is currently in the action stage of change. As such, her goal is to continue with weight loss efforts. She has agreed to keeping a food journal and adhering to recommended goals of 1200 calories and 90-90 grams of protein.   She will work on meal planning, mindful eating, and increasing her protein.  Recipes II sheet provided today.  Exercise goals: As is.  Behavioral modification strategies: increasing lean protein intake, decreasing simple carbohydrates, increasing vegetables, increasing water intake, decreasing eating out, no skipping meals, meal planning and cooking strategies, keeping healthy foods in the home and planning for success.  Madeline Warren has agreed to follow-up with our clinic in 2-3 weeks. She was informed of the importance of frequent follow-up visits to maximize her success with intensive lifestyle modifications for her multiple health conditions.   Objective:   Blood pressure 111/83, pulse 66, temperature 98.2 F (36.8 C), height 5\' 5"  (1.651 m), weight 223 lb (101.2 kg), last menstrual period 12/22/2013, SpO2 100 %. Body mass index is 37.11 kg/m.  General: Cooperative, alert, well developed, in no acute distress. HEENT: Conjunctivae and lids unremarkable. Cardiovascular: Regular rhythm.  Lungs: Normal work of breathing. Neurologic: No focal deficits.   Lab Results  Component Value Date   CREATININE 0.69 12/30/2019   BUN 11 12/30/2019   NA 143 12/30/2019   K 5.2  12/30/2019   CL 106 12/30/2019   CO2 22 12/30/2019   Lab Results  Component Value Date   ALT 13 12/30/2019   AST 15 12/30/2019   ALKPHOS 73 12/30/2019   BILITOT 0.3 12/30/2019   Lab Results  Component Value Date   HGBA1C  5.6 12/30/2019   Lab Results  Component Value Date   INSULIN 17.3 12/30/2019   Lab Results  Component Value Date   TSH 4.740 (H) 12/30/2019   Lab Results  Component Value Date   CHOL 224 (H) 12/30/2019   HDL 44 12/30/2019   LDLCALC 165 (H) 12/30/2019   TRIG 85 12/30/2019   Lab Results  Component Value Date   WBC 10.6 (H) 01/07/2014   HGB 10.5 (L) 01/07/2014   HCT 33.5 (L) 01/07/2014   MCV 80.3 01/07/2014   PLT 287 01/07/2014   Attestation Statements:   Reviewed by clinician on day of visit: allergies, medications, problem list, medical history, surgical history, family history, social history, and previous encounter notes.  Time spent on visit including pre-visit chart review and post-visit care and charting was 20 minutes.   I, Water quality scientist, CMA, am acting as Location manager for [employer], DO  I have reviewed the above documentation for accuracy and completeness, and I agree with the above. Jearld Lesch, DO

## 2020-04-26 ENCOUNTER — Encounter (INDEPENDENT_AMBULATORY_CARE_PROVIDER_SITE_OTHER): Payer: Self-pay | Admitting: Bariatrics

## 2020-04-26 ENCOUNTER — Ambulatory Visit (INDEPENDENT_AMBULATORY_CARE_PROVIDER_SITE_OTHER): Payer: Commercial Managed Care - PPO | Admitting: Bariatrics

## 2020-04-26 ENCOUNTER — Other Ambulatory Visit: Payer: Self-pay

## 2020-04-26 VITALS — BP 119/82 | HR 58 | Temp 98.4°F | Ht 65.0 in | Wt 225.0 lb

## 2020-04-26 DIAGNOSIS — G8929 Other chronic pain: Secondary | ICD-10-CM | POA: Diagnosis not present

## 2020-04-26 DIAGNOSIS — E6609 Other obesity due to excess calories: Secondary | ICD-10-CM

## 2020-04-26 DIAGNOSIS — M25562 Pain in left knee: Secondary | ICD-10-CM | POA: Diagnosis not present

## 2020-04-26 DIAGNOSIS — E559 Vitamin D deficiency, unspecified: Secondary | ICD-10-CM | POA: Diagnosis not present

## 2020-04-26 DIAGNOSIS — Z6837 Body mass index (BMI) 37.0-37.9, adult: Secondary | ICD-10-CM

## 2020-05-02 ENCOUNTER — Encounter (INDEPENDENT_AMBULATORY_CARE_PROVIDER_SITE_OTHER): Payer: Self-pay | Admitting: Bariatrics

## 2020-05-02 NOTE — Progress Notes (Signed)
Chief Complaint:   OBESITY Madeline Warren is here to discuss her progress with her obesity treatment plan along with follow-up of her obesity related diagnoses. Madeline Warren is on keeping a food journal and adhering to recommended goals of 1200 calories and 80 grams of protein and states she is following her eating plan approximately 10% of the time. Madeline Warren states she is not exercising regularly at this time.  Today's visit was #: 9 Starting weight: 237 lbs Starting date: 12/30/2019 Today's weight: 225 lbs Today's date: 04/26/2020 Total lbs lost to date: 12 lbs Total lbs lost since last in-office visit: 0  Interim History: Madeline Warren is up 2 pounds since her last visit, but has done well overall.  Subjective:   1. Chronic pain of left knee She is on no medications for pain.  2. Vitamin D deficiency Madeline Warren's Vitamin D level was 33.2 on 12/30/2019. She is currently taking prescription vitamin D 50,000 IU each week. She denies nausea, vomiting or muscle weakness.  Assessment/Plan:   1. Chronic pain of left knee No pounding exercises.  Will resume walking.  2. Vitamin D deficiency Low Vitamin D level contributes to fatigue and are associated with obesity, breast, and colon cancer. She agrees to continue to take prescription Vitamin D @50 ,000 IU every week and will follow-up for routine testing of Vitamin D, at least 2-3 times per year to avoid over-replacement.  3. Class 2 severe obesity with serious comorbidity and body mass index (BMI) of 37.0 to 37.9 in adult, unspecified obesity type Madeline Warren)  Madeline Warren is currently in the action stage of change. As such, her goal is to continue with weight loss efforts. She has agreed to the Category 2 Plan, keeping a food journal and adhering to recommended goals of 1200 calories and 80-90 grams of protein and the [place].   She will work in meal planning, will adhere more closely to the plan, will resume tracking, and will increase her protein  intake.  Exercise goals: All adults should avoid inactivity. Some physical activity is better than none, and adults who participate in any amount of physical activity gain some health benefits.  Behavioral modification strategies: increasing lean protein intake, decreasing simple carbohydrates, increasing vegetables, increasing water intake, decreasing eating out, no skipping meals, meal planning and cooking strategies, keeping healthy foods in the home and planning for success.  Madeline Warren has agreed to follow-up with our clinic in 2 weeks, fasting. She was informed of the importance of frequent follow-up visits to maximize her success with intensive lifestyle modifications for her multiple health conditions.   Objective:   Blood pressure 119/82, pulse (!) 58, temperature 98.4 F (36.9 C), height 5\' 5"  (1.651 m), weight 225 lb (102.1 kg), last menstrual period 12/22/2013, SpO2 98 %. Body mass index is 37.44 kg/m.  General: Cooperative, alert, well developed, in no acute distress. HEENT: Conjunctivae and lids unremarkable. Cardiovascular: Regular rhythm.  Lungs: Normal work of breathing. Neurologic: No focal deficits.   Lab Results  Component Value Date   CREATININE 0.69 12/30/2019   BUN 11 12/30/2019   NA 143 12/30/2019   K 5.2 12/30/2019   CL 106 12/30/2019   CO2 22 12/30/2019   Lab Results  Component Value Date   ALT 13 12/30/2019   AST 15 12/30/2019   ALKPHOS 73 12/30/2019   BILITOT 0.3 12/30/2019   Lab Results  Component Value Date   HGBA1C 5.6 12/30/2019   Lab Results  Component Value Date   INSULIN 17.3 12/30/2019  Lab Results  Component Value Date   TSH 4.740 (H) 12/30/2019   Lab Results  Component Value Date   CHOL 224 (H) 12/30/2019   HDL 44 12/30/2019   LDLCALC 165 (H) 12/30/2019   TRIG 85 12/30/2019   Lab Results  Component Value Date   WBC 10.6 (H) 01/07/2014   HGB 10.5 (L) 01/07/2014   HCT 33.5 (L) 01/07/2014   MCV 80.3 01/07/2014   PLT 287  01/07/2014   Attestation Statements:   Reviewed by clinician on day of visit: allergies, medications, problem list, medical history, surgical history, family history, social history, and previous encounter notes.  Time spent on visit including pre-visit chart review and post-visit care and charting was 20 minutes.   I, Water quality scientist, CMA, am acting as Location manager for [employer], DO  I have reviewed the above documentation for accuracy and completeness, and I agree with the above. Jearld Lesch, DO

## 2020-05-10 ENCOUNTER — Ambulatory Visit (INDEPENDENT_AMBULATORY_CARE_PROVIDER_SITE_OTHER): Payer: Commercial Managed Care - PPO | Admitting: Bariatrics

## 2020-05-15 ENCOUNTER — Encounter (INDEPENDENT_AMBULATORY_CARE_PROVIDER_SITE_OTHER): Payer: Self-pay | Admitting: Bariatrics

## 2020-05-16 ENCOUNTER — Ambulatory Visit (INDEPENDENT_AMBULATORY_CARE_PROVIDER_SITE_OTHER): Payer: Commercial Managed Care - PPO | Admitting: Bariatrics

## 2020-05-16 ENCOUNTER — Other Ambulatory Visit (INDEPENDENT_AMBULATORY_CARE_PROVIDER_SITE_OTHER): Payer: Self-pay | Admitting: Bariatrics

## 2020-05-16 DIAGNOSIS — E559 Vitamin D deficiency, unspecified: Secondary | ICD-10-CM

## 2020-05-16 MED ORDER — VITAMIN D (ERGOCALCIFEROL) 1.25 MG (50000 UNIT) PO CAPS: 50000.0000 [IU] | ORAL_CAPSULE | ORAL | 0 refills | Status: DC

## 2020-05-16 NOTE — Telephone Encounter (Signed)
Refill request

## 2020-06-06 ENCOUNTER — Other Ambulatory Visit: Payer: Self-pay

## 2020-06-06 ENCOUNTER — Ambulatory Visit (INDEPENDENT_AMBULATORY_CARE_PROVIDER_SITE_OTHER): Payer: Commercial Managed Care - PPO | Admitting: Bariatrics

## 2020-06-06 ENCOUNTER — Encounter (INDEPENDENT_AMBULATORY_CARE_PROVIDER_SITE_OTHER): Payer: Self-pay | Admitting: Bariatrics

## 2020-06-06 VITALS — BP 117/75 | HR 67 | Temp 97.8°F | Ht 65.0 in | Wt 223.0 lb

## 2020-06-06 DIAGNOSIS — E559 Vitamin D deficiency, unspecified: Secondary | ICD-10-CM | POA: Diagnosis not present

## 2020-06-06 DIAGNOSIS — Z6839 Body mass index (BMI) 39.0-39.9, adult: Secondary | ICD-10-CM | POA: Diagnosis not present

## 2020-06-06 DIAGNOSIS — Z9189 Other specified personal risk factors, not elsewhere classified: Secondary | ICD-10-CM | POA: Diagnosis not present

## 2020-06-06 DIAGNOSIS — E7800 Pure hypercholesterolemia, unspecified: Secondary | ICD-10-CM | POA: Diagnosis not present

## 2020-06-06 MED ORDER — VITAMIN D (ERGOCALCIFEROL) 1.25 MG (50000 UNIT) PO CAPS: 50000.0000 [IU] | ORAL_CAPSULE | ORAL | 0 refills | Status: DC

## 2020-06-07 ENCOUNTER — Encounter (INDEPENDENT_AMBULATORY_CARE_PROVIDER_SITE_OTHER): Payer: Self-pay | Admitting: Bariatrics

## 2020-06-07 NOTE — Progress Notes (Signed)
Chief Complaint:   OBESITY Madeline Warren is here to discuss her progress with her obesity treatment plan along with follow-up of her obesity related diagnoses. [name] is on keeping a food journal and adhering to recommended goals of 1200 calories and 80+ g protein and states she is following her eating plan approximately 50% of the time. [name] states she is walking 2 miles 3 times per week.  Today's visit was #: 10 Starting weight: 237 lbs Starting date: 12/30/2019 Today's weight: 223 lbs Today's date: 06/06/2020 Total lbs lost to date: 14 lbs Total lbs lost since last in-office visit: 2 lbs  Interim History: Raven is down 2 lbs since her last visit. She is doing well with her water intake. She had increased cravings.   Subjective:   1. Vitamin D deficiency Madeline Warren's Vitamin D level was 33.2 on 12/30/2019. She is currently taking prescription vitamin D 50,000 IU each week. She denies nausea, vomiting or muscle weakness.   Ref. Range 12/30/2019 08:46  Vitamin D, 25-Hydroxy Latest Ref Range: 30.0 - 100.0 ng/mL 33.2   2. Elevated cholesterol Madeline Warren is not on statin therapy.  Lab Results  Component Value Date   ALT 13 12/30/2019   AST 15 12/30/2019   ALKPHOS 73 12/30/2019   BILITOT 0.3 12/30/2019   Lab Results  Component Value Date   CHOL 224 (H) 12/30/2019   HDL 44 12/30/2019   LDLCALC 165 (H) 12/30/2019   TRIG 85 12/30/2019    3. At risk for heart disease Madeline Warren is at a higher than average risk for cardiovascular disease due to obesity.   Assessment/Plan:   1. Vitamin D deficiency Low Vitamin D level contributes to fatigue and are associated with obesity, breast, and colon cancer. She agrees to continue to take prescription Vitamin D @50 ,000 IU every week and will follow-up for routine testing of Vitamin D, at least 2-3 times per year to avoid over-replacement.  - Vitamin D, Ergocalciferol, (DRISDOL) 1.25 MG (50000 UNIT) CAPS capsule; Take 1 capsule (50,000  Units total) by mouth every 7 (seven) days.  Dispense: 4 capsule; Refill: 0  2. Elevated cholesterol Cardiovascular risk and specific lipid/LDL goals reviewed.  We discussed several lifestyle modifications today and [name] will continue to work on diet, exercise and weight loss efforts. Orders and follow up as documented in patient record. Increase PUFA's Na MUFA's.  Counseling Intensive lifestyle modifications are the first line treatment for this issue. . Dietary changes: Increase soluble fiber. Decrease simple carbohydrates. . Exercise changes: Moderate to vigorous-intensity aerobic activity 150 minutes per week if tolerated. . Lipid-lowering medications: see documented in medical record.  3. At risk for heart disease Madeline Warren was given approximately 15 minutes of coronary artery disease prevention counseling today. She is 50 y.o. female and has risk factors for heart disease including obesity. We discussed intensive lifestyle modifications today with an emphasis on specific weight loss instructions and strategies.   Repetitive spaced learning was employed today to elicit superior memory formation and behavioral change.  4. Obesity current BMI 37.11 Madeline Warren is currently in the action stage of change. As such, her goal is to continue with weight loss efforts. She has agreed to keeping a food journal and adhering to recommended goals of 1200 calories and 80+ g protein.   1. Meal plan 2. Increase protein and always eat your protein first 3. Check labs at next OV.  Exercise goals: As is  Behavioral modification strategies: increasing lean protein intake, decreasing simple carbohydrates, increasing vegetables,  increasing water intake, decreasing eating out, no skipping meals, meal planning and cooking strategies, keeping healthy foods in the home and planning for success.  Madeline Warren has agreed to follow-up with our clinic in 2 weeks fasting. She was informed of the importance of frequent  follow-up visits to maximize her success with intensive lifestyle modifications for her multiple health conditions.   Objective:   Blood pressure 117/75, pulse 67, temperature 97.8 F (36.6 C), height 5\' 5"  (1.651 m), weight 223 lb (101.2 kg), last menstrual period 12/22/2013, SpO2 99 %. Body mass index is 37.11 kg/m.  General: Cooperative, alert, well developed, in no acute distress. HEENT: Conjunctivae and lids unremarkable. Cardiovascular: Regular rhythm.  Lungs: Normal work of breathing. Neurologic: No focal deficits.   Lab Results  Component Value Date   CREATININE 0.69 12/30/2019   BUN 11 12/30/2019   NA 143 12/30/2019   K 5.2 12/30/2019   CL 106 12/30/2019   CO2 22 12/30/2019   Lab Results  Component Value Date   ALT 13 12/30/2019   AST 15 12/30/2019   ALKPHOS 73 12/30/2019   BILITOT 0.3 12/30/2019   Lab Results  Component Value Date   HGBA1C 5.6 12/30/2019   Lab Results  Component Value Date   INSULIN 17.3 12/30/2019   Lab Results  Component Value Date   TSH 4.740 (H) 12/30/2019   Lab Results  Component Value Date   CHOL 224 (H) 12/30/2019   HDL 44 12/30/2019   LDLCALC 165 (H) 12/30/2019   TRIG 85 12/30/2019   Lab Results  Component Value Date   WBC 10.6 (H) 01/07/2014   HGB 10.5 (L) 01/07/2014   HCT 33.5 (L) 01/07/2014   MCV 80.3 01/07/2014   PLT 287 01/07/2014   No results found for: IRON, TIBC, FERRITIN   Attestation Statements:   Reviewed by clinician on day of visit: allergies, medications, problem list, medical history, surgical history, family history, social history, and previous encounter notes.  Coral Ceo, am acting as Location manager for [employer], DO.  I have reviewed the above documentation for accuracy and completeness, and I agree with the above. Jearld Lesch, DO

## 2020-06-21 ENCOUNTER — Encounter (INDEPENDENT_AMBULATORY_CARE_PROVIDER_SITE_OTHER): Payer: Self-pay | Admitting: Bariatrics

## 2020-06-21 ENCOUNTER — Ambulatory Visit (INDEPENDENT_AMBULATORY_CARE_PROVIDER_SITE_OTHER): Payer: Commercial Managed Care - PPO | Admitting: Bariatrics

## 2020-06-21 ENCOUNTER — Other Ambulatory Visit: Payer: Self-pay

## 2020-06-21 VITALS — BP 120/82 | HR 69 | Temp 97.7°F | Ht 65.0 in | Wt 223.0 lb

## 2020-06-21 DIAGNOSIS — Z6839 Body mass index (BMI) 39.0-39.9, adult: Secondary | ICD-10-CM

## 2020-06-21 DIAGNOSIS — E559 Vitamin D deficiency, unspecified: Secondary | ICD-10-CM | POA: Diagnosis not present

## 2020-06-21 DIAGNOSIS — E8881 Metabolic syndrome: Secondary | ICD-10-CM | POA: Diagnosis not present

## 2020-06-21 DIAGNOSIS — R7989 Other specified abnormal findings of blood chemistry: Secondary | ICD-10-CM | POA: Diagnosis not present

## 2020-06-21 DIAGNOSIS — Z9189 Other specified personal risk factors, not elsewhere classified: Secondary | ICD-10-CM | POA: Diagnosis not present

## 2020-06-21 DIAGNOSIS — E6609 Other obesity due to excess calories: Secondary | ICD-10-CM

## 2020-06-21 DIAGNOSIS — E7800 Pure hypercholesterolemia, unspecified: Secondary | ICD-10-CM | POA: Diagnosis not present

## 2020-06-22 LAB — COMPREHENSIVE METABOLIC PANEL
ALT: 13 IU/L (ref 0–32)
AST: 15 IU/L (ref 0–40)
Albumin/Globulin Ratio: 1.8 (ref 1.2–2.2)
Albumin: 4.5 g/dL (ref 3.8–4.9)
Alkaline Phosphatase: 70 IU/L (ref 44–121)
BUN/Creatinine Ratio: 16 (ref 9–23)
BUN: 12 mg/dL (ref 6–24)
Bilirubin Total: 0.4 mg/dL (ref 0.0–1.2)
CO2: 22 mmol/L (ref 20–29)
Calcium: 9.5 mg/dL (ref 8.7–10.2)
Chloride: 104 mmol/L (ref 96–106)
Creatinine, Ser: 0.77 mg/dL (ref 0.57–1.00)
Globulin, Total: 2.5 g/dL (ref 1.5–4.5)
Glucose: 95 mg/dL (ref 65–99)
Potassium: 4.8 mmol/L (ref 3.5–5.2)
Sodium: 142 mmol/L (ref 134–144)
Total Protein: 7 g/dL (ref 6.0–8.5)
eGFR: 93 mL/min/{1.73_m2} (ref >59)

## 2020-06-22 LAB — HEMOGLOBIN A1C
Est. average glucose Bld gHb Est-mCnc: 103 mg/dL
Hgb A1c MFr Bld: 5.2 % (ref 4.8–5.6)

## 2020-06-22 LAB — LIPID PANEL WITH LDL/HDL RATIO
Cholesterol, Total: 223 mg/dL — ABNORMAL HIGH (ref 100–199)
HDL: 46 mg/dL (ref >39)
LDL Chol Calc (NIH): 159 mg/dL — ABNORMAL HIGH (ref 0–99)
LDL/HDL Ratio: 3.5 ratio — ABNORMAL HIGH (ref 0.0–3.2)
Triglycerides: 101 mg/dL (ref 0–149)
VLDL Cholesterol Cal: 18 mg/dL (ref 5–40)

## 2020-06-22 LAB — VITAMIN D 25 HYDROXY (VIT D DEFICIENCY, FRACTURES): Vit D, 25-Hydroxy: 32.5 ng/mL (ref 30.0–100.0)

## 2020-06-22 LAB — INSULIN, RANDOM: INSULIN: 13.1 u[IU]/mL (ref 2.6–24.9)

## 2020-06-22 LAB — TSH+T4F+T3FREE
Free T4: 1.07 ng/dL (ref 0.82–1.77)
T3, Free: 3.2 pg/mL (ref 2.0–4.4)
TSH: 3.19 u[IU]/mL (ref 0.450–4.500)

## 2020-06-23 ENCOUNTER — Encounter (INDEPENDENT_AMBULATORY_CARE_PROVIDER_SITE_OTHER): Payer: Self-pay | Admitting: Bariatrics

## 2020-06-23 NOTE — Progress Notes (Signed)
Chief Complaint:   OBESITY Madeline Warren is here to discuss her progress with her obesity treatment plan along with follow-up of her obesity related diagnoses. Madeline Warren is on keeping a food journal and adhering to recommended goals of 1200 calories and 80 grams of protein and states she is following her eating plan approximately 50% of the time. Madeline Warren states she is not exercising regularly at this time.  Today's visit was #: 11 Starting weight: 237 lbs Starting date: 12/30/2019 Today's weight: 223 lbs Today's date: 06/21/2020 Total lbs lost to date: 14 lbs Total lbs lost since last in-office visit: 0  Interim History: Madeline Warren's weight remains the same.  She is getting less water.  Subjective:   1. Vitamin D deficiency Madeline Warren's Vitamin D level was 33.2 on 12/30/2019. She is currently taking prescription vitamin D 50,000 IU each week. She denies nausea, vomiting or muscle weakness.  2. Insulin resistance Madeline Warren has a diagnosis of insulin resistance based on her elevated fasting insulin level >5. She continues to work on diet and exercise to decrease her risk of diabetes.  She is on no medication for this.  Lab Results  Component Value Date   INSULIN 13.1 06/21/2020   INSULIN 17.3 12/30/2019   Lab Results  Component Value Date   HGBA1C 5.2 06/21/2020   3. Elevated cholesterol Journiee has elevated cholesterol and has been trying to improve her cholesterol levels with intensive lifestyle modification including a low saturated fat diet, exercise and weight loss. She denies any chest pain, claudication or myalgias.  No medication.  Lab Results  Component Value Date   ALT 13 06/21/2020   AST 15 06/21/2020   ALKPHOS 70 06/21/2020   BILITOT 0.4 06/21/2020   Lab Results  Component Value Date   CHOL 223 (H) 06/21/2020   HDL 46 06/21/2020   LDLCALC 159 (H) 06/21/2020   TRIG 101 06/21/2020   4. Elevated TSH Manageable.  Lab Results  Component Value Date   TSH 3.190  06/21/2020   5. At risk for osteoporosis Madeline Warren is at higher risk of osteopenia and osteoporosis due to Vitamin D deficiency.   Assessment/Plan:   1. Vitamin D deficiency Low Vitamin D level contributes to fatigue and are associated with obesity, breast, and colon cancer. She agrees to continue to take prescription Vitamin D @50 ,000 IU every week.  Will check vitamin D level today, as per below.  - VITAMIN D 25 Hydroxy (Vit-D Deficiency, Fractures)  2. Insulin resistance Madeline Warren will continue to work on weight loss, exercise, and decreasing simple carbohydrates to help decrease the risk of diabetes. Madeline Warren agreed to follow-up with [place] as directed to closely monitor her progress.  Will check A1c, insulin, and CMP today.  - Comprehensive metabolic panel - Insulin, random - Hemoglobin A1c  3. Elevated cholesterol Cardiovascular risk and specific lipid/LDL goals reviewed.  We discussed several lifestyle modifications today and Madeline Warren will continue to work on diet, exercise and weight loss efforts. Orders and follow up as documented in patient record.  Will check lipid panel today.  Counseling Intensive lifestyle modifications are the first line treatment for this issue. . Dietary changes: Increase soluble fiber. Decrease simple carbohydrates. . Exercise changes: Moderate to vigorous-intensity aerobic activity 150 minutes per week if tolerated. . Lipid-lowering medications: see documented in medical record.  - Lipid Panel With LDL/HDL Ratio  4. Elevated TSH Patient with long-standing hypothyroidism, on levothyroxine therapy. She appears euthyroid. Orders and follow up as documented in patient record.  Will  check thyroid panel today.  Counseling . Good thyroid control is important for overall health. Supratherapeutic thyroid levels are dangerous and will not improve weight loss results. . The correct way to take levothyroxine is fasting, with water, separated by at least 30 minutes  from breakfast, and separated by more than 4 hours from calcium, iron, multivitamins, acid reflux medications (PPIs).   - TSH+T4F+T3Free  5. At risk for osteoporosis Madeline Warren was given approximately 15 minutes of osteoporosis prevention counseling today. Madeline Warren is at risk for osteopenia and osteoporosis due to her Vitamin D deficiency. She was encouraged to take her Vitamin D and follow her higher calcium diet and increase strengthening exercise to help strengthen her bones and decrease her risk of osteopenia and osteoporosis.  Repetitive spaced learning was employed today to elicit superior memory formation and behavioral change.  6. Obesity, BMI today Madeline Warren is currently in the action stage of change. As such, her goal is to continue with weight loss efforts. She has agreed to keeping a food journal and adhering to recommended goals of 1200 calories and 80 grams of protein.   She will work on being more adherent to the plan, mindful eating, and increasing her water and protein intake.  Exercise goals: All adults should avoid inactivity. Some physical activity is better than none, and adults who participate in any amount of physical activity gain some health benefits.  Behavioral modification strategies: increasing lean protein intake, decreasing simple carbohydrates, increasing vegetables, increasing water intake, decreasing eating out, no skipping meals, meal planning and cooking strategies, keeping healthy foods in the home and planning for success.  Madeline Warren has agreed to follow-up with our clinic in 2 weeks. She was informed of the importance of frequent follow-up visits to maximize her success with intensive lifestyle modifications for her multiple health conditions.   Objective:   Blood pressure 120/82, pulse 69, temperature 97.7 F (36.5 C), height 5\' 5"  (1.651 m), weight 223 lb (101.2 kg), last menstrual period 12/22/2013, SpO2 98 %. Body mass index is 37.11 kg/m.  General:  Cooperative, alert, well developed, in no acute distress. HEENT: Conjunctivae and lids unremarkable. Cardiovascular: Regular rhythm.  Lungs: Normal work of breathing. Neurologic: No focal deficits.   Lab Results  Component Value Date   CREATININE 0.77 06/21/2020   BUN 12 06/21/2020   NA 142 06/21/2020   K 4.8 06/21/2020   CL 104 06/21/2020   CO2 22 06/21/2020   Lab Results  Component Value Date   ALT 13 06/21/2020   AST 15 06/21/2020   ALKPHOS 70 06/21/2020   BILITOT 0.4 06/21/2020   Lab Results  Component Value Date   HGBA1C 5.2 06/21/2020   HGBA1C 5.6 12/30/2019   Lab Results  Component Value Date   INSULIN 13.1 06/21/2020   INSULIN 17.3 12/30/2019   Lab Results  Component Value Date   TSH 3.190 06/21/2020   Lab Results  Component Value Date   CHOL 223 (H) 06/21/2020   HDL 46 06/21/2020   LDLCALC 159 (H) 06/21/2020   TRIG 101 06/21/2020   Lab Results  Component Value Date   WBC 10.6 (H) 01/07/2014   HGB 10.5 (L) 01/07/2014   HCT 33.5 (L) 01/07/2014   MCV 80.3 01/07/2014   PLT 287 01/07/2014   Attestation Statements:   Reviewed by clinician on day of visit: allergies, medications, problem list, medical history, surgical history, family history, social history, and previous encounter notes.  I, Water quality scientist, CMA, am acting as Location manager for  Jearld Lesch, DO  I have reviewed the above documentation for accuracy and completeness, and I agree with the above. Jearld Lesch, DO

## 2020-07-13 ENCOUNTER — Ambulatory Visit (INDEPENDENT_AMBULATORY_CARE_PROVIDER_SITE_OTHER): Payer: Commercial Managed Care - PPO | Admitting: Bariatrics

## 2020-07-13 ENCOUNTER — Other Ambulatory Visit: Payer: Self-pay

## 2020-07-13 ENCOUNTER — Encounter (INDEPENDENT_AMBULATORY_CARE_PROVIDER_SITE_OTHER): Payer: Self-pay | Admitting: Bariatrics

## 2020-07-13 VITALS — BP 120/84 | HR 74 | Temp 98.1°F | Ht 65.0 in | Wt 223.0 lb

## 2020-07-13 DIAGNOSIS — E8881 Metabolic syndrome: Secondary | ICD-10-CM | POA: Diagnosis not present

## 2020-07-13 DIAGNOSIS — Z9189 Other specified personal risk factors, not elsewhere classified: Secondary | ICD-10-CM | POA: Diagnosis not present

## 2020-07-13 DIAGNOSIS — E559 Vitamin D deficiency, unspecified: Secondary | ICD-10-CM

## 2020-07-13 DIAGNOSIS — Z6839 Body mass index (BMI) 39.0-39.9, adult: Secondary | ICD-10-CM | POA: Diagnosis not present

## 2020-07-13 DIAGNOSIS — E6609 Other obesity due to excess calories: Secondary | ICD-10-CM

## 2020-07-13 MED ORDER — OZEMPIC (0.25 OR 0.5 MG/DOSE) 2 MG/1.5ML ~~LOC~~ SOPN: 0.2500 mg | PEN_INJECTOR | SUBCUTANEOUS | 0 refills | Status: DC

## 2020-07-13 MED ORDER — VITAMIN D (ERGOCALCIFEROL) 1.25 MG (50000 UNIT) PO CAPS: 50000.0000 [IU] | ORAL_CAPSULE | ORAL | 0 refills | Status: DC

## 2020-07-14 NOTE — Progress Notes (Signed)
Chief Complaint:   OBESITY Madeline Warren is here to discuss her progress with her obesity treatment plan along with follow-up of her obesity related diagnoses. Madeline Warren is on keeping a food journal and adhering to recommended goals of 1200 calories and 80 grams of protein daily and states she is following her eating plan approximately 50% of the time. Madeline Warren states she is walking for 20 minutes 2 times per week.  Today's visit was #: 12 Starting weight: 237 lbs Starting date: 12/30/2019 Today's weight: 223 lbs Today's date: 07/13/2020 Total lbs lost to date: 14 Total lbs lost since last in-office visit: 0  Interim History: Madeline Warren's weight remains the same since her last visit. She still has issues with getting adequate protein.  Subjective:   1. Insulin resistance Madeline Warren is not currently on medications.  2. Vitamin D deficiency Madeline Warren is taking Vit D, and she denies muscle weakness, nausea, or vomiting.  3. At risk for diabetes mellitus Madeline Warren is at higher than average risk for developing diabetes due to obesity and insulin resistance.  Assessment/Plan:   1. Insulin resistance Madeline Warren agreed to start Ozempic 0.25 mg once weekly with no refills. She will continue to work on weight loss, exercise, increasing healthy fats and protein, and decreasing simple carbohydrates to help decrease the risk of diabetes. Madeline Warren agreed to follow-up with [place] as directed to closely monitor her progress.  - Semaglutide,0.25 or 0.5MG /DOS, (OZEMPIC, 0.25 OR 0.5 MG/DOSE,) 2 MG/1.5ML SOPN; Inject 0.25 mg into the skin once a week.  Dispense: 1.5 mL; Refill: 0  2. Vitamin D deficiency Low Vitamin D level contributes to fatigue and are associated with obesity, breast, and colon cancer. We will refill prescription Vitamin D for 1 month. Madeline Warren will follow-up for routine testing of Vitamin D, at least 2-3 times per year to avoid over-replacement.  - Vitamin D, Ergocalciferol, (DRISDOL) 1.25 MG (50000  UNIT) CAPS capsule; Take 1 capsule (50,000 Units total) by mouth every 7 (seven) days.  Dispense: 4 capsule; Refill: 0  3. At risk for diabetes mellitus Madeline Warren was given approximately 15 minutes of diabetes education and counseling today. We discussed intensive lifestyle modifications today with an emphasis on weight loss as well as increasing exercise and decreasing simple carbohydrates in her diet. We also reviewed medication options with an emphasis on risk versus benefit of those discussed.   Repetitive spaced learning was employed today to elicit superior memory formation and behavioral change.  4. Obesity, BMI today 41 Madeline Warren is currently in the action stage of change. As such, her goal is to continue with weight loss efforts. She has agreed to keeping a food journal and adhering to recommended goals of 1200 calories and 80 grams of protein daily.   Intentional eating was discussed. I reviewed labs with the patient today (CMP, Lipids, Vit D, A1c, insulin, and thyroid panel).  Exercise goals: As is.  Behavioral modification strategies: increasing lean protein intake, decreasing simple carbohydrates, increasing vegetables, increasing water intake, decreasing eating out, no skipping meals, meal planning and cooking strategies, keeping healthy foods in the home and planning for success.  Madeline Warren has agreed to follow-up with our clinic in 2 weeks. She was informed of the importance of frequent follow-up visits to maximize her success with intensive lifestyle modifications for her multiple health conditions.   Objective:   Blood pressure 120/84, pulse 74, temperature 98.1 F (36.7 C), height 5\' 5"  (1.651 m), weight 223 lb (101.2 kg), last menstrual period 12/22/2013, SpO2 99 %. Body  mass index is 37.11 kg/m.  General: Cooperative, alert, well developed, in no acute distress. HEENT: Conjunctivae and lids unremarkable. Cardiovascular: Regular rhythm.  Lungs: Normal work of  breathing. Neurologic: No focal deficits.   Lab Results  Component Value Date   CREATININE 0.77 06/21/2020   BUN 12 06/21/2020   NA 142 06/21/2020   K 4.8 06/21/2020   CL 104 06/21/2020   CO2 22 06/21/2020   Lab Results  Component Value Date   ALT 13 06/21/2020   AST 15 06/21/2020   ALKPHOS 70 06/21/2020   BILITOT 0.4 06/21/2020   Lab Results  Component Value Date   HGBA1C 5.2 06/21/2020   HGBA1C 5.6 12/30/2019   Lab Results  Component Value Date   INSULIN 13.1 06/21/2020   INSULIN 17.3 12/30/2019   Lab Results  Component Value Date   TSH 3.190 06/21/2020   Lab Results  Component Value Date   CHOL 223 (H) 06/21/2020   HDL 46 06/21/2020   LDLCALC 159 (H) 06/21/2020   TRIG 101 06/21/2020   Lab Results  Component Value Date   WBC 10.6 (H) 01/07/2014   HGB 10.5 (L) 01/07/2014   HCT 33.5 (L) 01/07/2014   MCV 80.3 01/07/2014   PLT 287 01/07/2014   No results found for: IRON, TIBC, FERRITIN  Attestation Statements:   Reviewed by clinician on day of visit: allergies, medications, problem list, medical history, surgical history, family history, social history, and previous encounter notes.   Wilhemena Durie, am acting as Location manager for [employer], DO.  I have reviewed the above documentation for accuracy and completeness, and I agree with the above. Jearld Lesch, DO

## 2020-07-18 ENCOUNTER — Encounter (INDEPENDENT_AMBULATORY_CARE_PROVIDER_SITE_OTHER): Payer: Self-pay | Admitting: Bariatrics

## 2020-07-27 ENCOUNTER — Ambulatory Visit (INDEPENDENT_AMBULATORY_CARE_PROVIDER_SITE_OTHER): Payer: Commercial Managed Care - PPO | Admitting: Bariatrics

## 2020-07-27 ENCOUNTER — Other Ambulatory Visit: Payer: Self-pay

## 2020-07-27 ENCOUNTER — Encounter (INDEPENDENT_AMBULATORY_CARE_PROVIDER_SITE_OTHER): Payer: Self-pay | Admitting: Bariatrics

## 2020-07-27 VITALS — BP 125/82 | HR 65 | Temp 98.0°F | Ht 65.0 in | Wt 223.0 lb

## 2020-07-27 DIAGNOSIS — E8881 Metabolic syndrome: Secondary | ICD-10-CM

## 2020-07-27 DIAGNOSIS — Z6839 Body mass index (BMI) 39.0-39.9, adult: Secondary | ICD-10-CM | POA: Diagnosis not present

## 2020-07-27 DIAGNOSIS — E7800 Pure hypercholesterolemia, unspecified: Secondary | ICD-10-CM

## 2020-07-27 DIAGNOSIS — Z9189 Other specified personal risk factors, not elsewhere classified: Secondary | ICD-10-CM | POA: Diagnosis not present

## 2020-07-27 MED ORDER — ONDANSETRON HCL 4 MG PO TABS: 4.0000 mg | ORAL_TABLET | Freq: Three times a day (TID) | ORAL | 0 refills | Status: DC | PRN

## 2020-07-28 NOTE — Progress Notes (Signed)
Chief Complaint:   OBESITY Madeline Warren is here to discuss her progress with her obesity treatment plan along with follow-up of her obesity related diagnoses. Madeline Warren is on keeping a food journal and adhering to recommended goals of 1200 calories and 80 g protein and states she is following her eating plan approximately 60% of the time. Madeline Warren states she is walking 15-25 minutes 3 times per week.  Today's visit was #: 42 Starting weight: 237 lbs Starting date: 12/30/2019 Today's weight: 223 lbs Today's date: 07/27/2020 Total lbs lost to date: 14 Total lbs lost since last in-office visit: 0  Interim History: Madeline Warren's weight remains the same since her last visit. She started Ozempic.  Subjective:   1. Insulin resistance Madeline Warren started Ozempic recently. She reports nausea.   2. Elevated cholesterol Madeline Warren is not on statin therapy.  3. At risk for dehydration Madeline Warren is at risk for dehydration due to not drinking adequate amount of water.  Assessment/Plan:   1. Insulin resistance Madeline Warren will continue to work on weight loss, exercise, and decreasing simple carbohydrates to help decrease the risk of diabetes. Madeline Warren agreed to follow-up with [place] as directed to closely monitor her progress. -Will titrate Ozempic over time. - ondansetron (ZOFRAN) 4 MG tablet; Take 1 tablet (4 mg total) by mouth every 8 (eight) hours as needed for nausea or vomiting.  Dispense: 20 tablet; Refill: 0  2. Elevated cholesterol Cardiovascular risk and specific lipid/LDL goals reviewed.  We discussed several lifestyle modifications today and Madeline Warren will continue to work on diet, exercise and weight loss efforts. Orders and follow up as documented in patient record.  -no trans fats -decrease saturated fats  Counseling Intensive lifestyle modifications are the first line treatment for this issue. . Dietary changes: Increase soluble fiber. Decrease simple carbohydrates. . Exercise changes: Moderate to  vigorous-intensity aerobic activity 150 minutes per week if tolerated. . Lipid-lowering medications: see documented in medical record.  3. At risk for dehydration Madeline Warren was given approximately 15 minutes dehydration prevention counseling today. Madeline Warren is at risk for dehydration due to weight loss and current medication(s). She was encouraged to hydrate and monitor fluid status to avoid dehydration as well as weight loss plateaus.   4. Obesity current BMI 58 Madeline Warren is currently in the action stage of change. As such, her goal is to continue with weight loss efforts. She has agreed to keeping a food journal and adhering to recommended goals of 1200 calories and 80 g protein.   Meal plan Intentional eating Increase protein  Exercise goals: As is  Behavioral modification strategies: increasing lean protein intake, decreasing simple carbohydrates, increasing vegetables, increasing water intake, decreasing eating out, no skipping meals, meal planning and cooking strategies, keeping healthy foods in the home and planning for success.  Madeline Warren has agreed to follow-up with our clinic in 2 weeks. She was informed of the importance of frequent follow-up visits to maximize her success with intensive lifestyle modifications for her multiple health conditions.   Objective:   Blood pressure 125/82, pulse 65, temperature 98 F (36.7 C), height 5\' 5"  (1.651 m), weight 223 lb (101.2 kg), last menstrual period 12/22/2013, SpO2 98 %. Body mass index is 37.11 kg/m.  General: Cooperative, alert, well developed, in no acute distress. HEENT: Conjunctivae and lids unremarkable. Cardiovascular: Regular rhythm.  Lungs: Normal work of breathing. Neurologic: No focal deficits.   Lab Results  Component Value Date   CREATININE 0.77 06/21/2020   BUN 12 06/21/2020   NA 142 06/21/2020  K 4.8 06/21/2020   CL 104 06/21/2020   CO2 22 06/21/2020   Lab Results  Component Value Date   ALT 13 06/21/2020    AST 15 06/21/2020   ALKPHOS 70 06/21/2020   BILITOT 0.4 06/21/2020   Lab Results  Component Value Date   HGBA1C 5.2 06/21/2020   HGBA1C 5.6 12/30/2019   Lab Results  Component Value Date   INSULIN 13.1 06/21/2020   INSULIN 17.3 12/30/2019   Lab Results  Component Value Date   TSH 3.190 06/21/2020   Lab Results  Component Value Date   CHOL 223 (H) 06/21/2020   HDL 46 06/21/2020   LDLCALC 159 (H) 06/21/2020   TRIG 101 06/21/2020   Lab Results  Component Value Date   WBC 10.6 (H) 01/07/2014   HGB 10.5 (L) 01/07/2014   HCT 33.5 (L) 01/07/2014   MCV 80.3 01/07/2014   PLT 287 01/07/2014   No results found for: IRON, TIBC, FERRITIN   Attestation Statements:   Reviewed by clinician on day of visit: allergies, medications, problem list, medical history, surgical history, family history, social history, and previous encounter notes.  Coral Ceo, CMA, am acting as Location manager for [employer], DO.  I have reviewed the above documentation for accuracy and completeness, and I agree with the above. Jearld Lesch, DO

## 2020-08-01 ENCOUNTER — Encounter (INDEPENDENT_AMBULATORY_CARE_PROVIDER_SITE_OTHER): Payer: Self-pay | Admitting: Bariatrics

## 2020-08-10 ENCOUNTER — Other Ambulatory Visit: Payer: Self-pay

## 2020-08-10 ENCOUNTER — Encounter (INDEPENDENT_AMBULATORY_CARE_PROVIDER_SITE_OTHER): Payer: Self-pay | Admitting: Bariatrics

## 2020-08-10 ENCOUNTER — Ambulatory Visit (INDEPENDENT_AMBULATORY_CARE_PROVIDER_SITE_OTHER): Payer: Commercial Managed Care - PPO | Admitting: Bariatrics

## 2020-08-10 VITALS — BP 127/86 | HR 59 | Temp 97.7°F | Ht 65.0 in | Wt 222.0 lb

## 2020-08-10 DIAGNOSIS — E559 Vitamin D deficiency, unspecified: Secondary | ICD-10-CM

## 2020-08-10 DIAGNOSIS — Z6839 Body mass index (BMI) 39.0-39.9, adult: Secondary | ICD-10-CM | POA: Diagnosis not present

## 2020-08-10 DIAGNOSIS — R632 Polyphagia: Secondary | ICD-10-CM | POA: Diagnosis not present

## 2020-08-10 DIAGNOSIS — Z9189 Other specified personal risk factors, not elsewhere classified: Secondary | ICD-10-CM

## 2020-08-10 MED ORDER — ONDANSETRON HCL 4 MG PO TABS: 4.0000 mg | ORAL_TABLET | Freq: Three times a day (TID) | ORAL | 0 refills | Status: DC | PRN

## 2020-08-10 MED ORDER — VITAMIN D (ERGOCALCIFEROL) 1.25 MG (50000 UNIT) PO CAPS: 50000.0000 [IU] | ORAL_CAPSULE | ORAL | 0 refills | Status: DC

## 2020-08-10 MED ORDER — OZEMPIC (0.25 OR 0.5 MG/DOSE) 2 MG/1.5ML ~~LOC~~ SOPN: 0.5000 mg | PEN_INJECTOR | SUBCUTANEOUS | 0 refills | Status: DC

## 2020-08-15 ENCOUNTER — Encounter (INDEPENDENT_AMBULATORY_CARE_PROVIDER_SITE_OTHER): Payer: Self-pay | Admitting: Bariatrics

## 2020-08-15 DIAGNOSIS — S92131A Displaced fracture of posterior process of right talus, initial encounter for closed fracture: Secondary | ICD-10-CM | POA: Diagnosis not present

## 2020-08-15 NOTE — Progress Notes (Signed)
Chief Complaint:   OBESITY Madeline Warren is here to discuss her progress with her obesity treatment plan along with follow-up of her obesity related diagnoses. Madeline Warren is on keeping a food journal and adhering to recommended goals of 1200 calories and 80 grams protein and states she is following her eating plan approximately 50% of the time. Madeline Warren states she is not currently exercising.  Today's visit was #: 14 Starting weight: 237 lbs Starting date: 12/30/2019 Today's weight: 222 lbs Today's date: 08/10/2020 Total lbs lost to date: 15 lbs Total lbs lost since last in-office visit: 1  Interim History: Madeline Warren is down an additional 1 lb since her last visit.  Subjective:   1. Polyphagia Madeline Warren endorses excessive hunger.   2. Vitamin D deficiency Madeline Warren is taking prescription Vit D and tolerating it well.  3. At risk for constipation Madeline Warren is at increased risk for constipation due to inadequate water intake, changes in diet, and/or use of medications such as GLP-1 agonists. Madeline Warren denies hard, infrequent stools currently.   Assessment/Plan:   1. Polyphagia Intensive lifestyle modifications are the first line treatment for this issue. We discussed several lifestyle modifications today and she will continue to work on diet, exercise and weight loss efforts. Orders and follow up as documented in patient record.  Counseling . Polyphagia is excessive hunger. . Causes can include: low blood sugars, hypERthyroidism, PMS, lack of sleep, stress, insulin resistance, diabetes, certain medications, and diets that are deficient in protein and fiber.    - ondansetron (ZOFRAN) 4 MG tablet; Take 1 tablet (4 mg total) by mouth every 8 (eight) hours as needed for nausea or vomiting.  Dispense: 20 tablet; Refill: 0 - Semaglutide,0.25 or 0.5MG /DOS, (OZEMPIC, 0.25 OR 0.5 MG/DOSE,) 2 MG/1.5ML SOPN; Inject 0.5 mg into the skin once a week.  Dispense: 1.5 mL; Refill: 0  2. Vitamin D  deficiency Low Vitamin D level contributes to fatigue and are associated with obesity, breast, and colon cancer. She agrees to continue to take prescription Vitamin D @50 ,000 IU every week and will follow-up for routine testing of Vitamin D, at least 2-3 times per year to avoid over-replacement. - Vitamin D, Ergocalciferol, (DRISDOL) 1.25 MG (50000 UNIT) CAPS capsule; Take 1 capsule (50,000 Units total) by mouth every 7 (seven) days.  Dispense: 4 capsule; Refill: 0  3. At risk for constipation Madeline Warren was given approximately 15 minutes of counseling today regarding prevention of constipation. She was encouraged to increase water and fiber intake.   4. Obesity current BMI 62 Madeline Warren is currently in the action stage of change. As such, her goal is to continue with weight loss efforts. She has agreed to keeping a food journal and adhering to recommended goals of 1200 calories and 80 grams protein.   Meal plan Will adhere more closely to the plan.  Exercise goals: As is  Behavioral modification strategies: increasing lean protein intake, decreasing simple carbohydrates, increasing vegetables, increasing water intake, decreasing eating out, no skipping meals, meal planning and cooking strategies, keeping healthy foods in the home and planning for success.  Madeline Warren has agreed to follow-up with our clinic in 2 weeks. She was informed of the importance of frequent follow-up visits to maximize her success with intensive lifestyle modifications for her multiple health conditions.   Objective:   Blood pressure 127/86, pulse (!) 59, temperature 97.7 F (36.5 C), height 5\' 5"  (1.651 m), weight 222 lb (100.7 kg), last menstrual period 12/22/2013, SpO2 98 %. Body mass index is 36.94  kg/m.  General: Cooperative, alert, well developed, in no acute distress. HEENT: Conjunctivae and lids unremarkable. Cardiovascular: Regular rhythm.  Lungs: Normal work of breathing. Neurologic: No focal deficits.   Lab  Results  Component Value Date   CREATININE 0.77 06/21/2020   BUN 12 06/21/2020   NA 142 06/21/2020   K 4.8 06/21/2020   CL 104 06/21/2020   CO2 22 06/21/2020   Lab Results  Component Value Date   ALT 13 06/21/2020   AST 15 06/21/2020   ALKPHOS 70 06/21/2020   BILITOT 0.4 06/21/2020   Lab Results  Component Value Date   HGBA1C 5.2 06/21/2020   HGBA1C 5.6 12/30/2019   Lab Results  Component Value Date   INSULIN 13.1 06/21/2020   INSULIN 17.3 12/30/2019   Lab Results  Component Value Date   TSH 3.190 06/21/2020   Lab Results  Component Value Date   CHOL 223 (H) 06/21/2020   HDL 46 06/21/2020   LDLCALC 159 (H) 06/21/2020   TRIG 101 06/21/2020   Lab Results  Component Value Date   WBC 10.6 (H) 01/07/2014   HGB 10.5 (L) 01/07/2014   HCT 33.5 (L) 01/07/2014   MCV 80.3 01/07/2014   PLT 287 01/07/2014     Attestation Statements:   Reviewed by clinician on day of visit: allergies, medications, problem list, medical history, surgical history, family history, social history, and previous encounter notes.  Coral Ceo, CMA, am acting as Location manager for [employer], DO.  I have reviewed the above documentation for accuracy and completeness, and I agree with the above. Jearld Lesch, DO

## 2020-08-24 ENCOUNTER — Ambulatory Visit (INDEPENDENT_AMBULATORY_CARE_PROVIDER_SITE_OTHER): Payer: Commercial Managed Care - PPO | Admitting: Bariatrics

## 2020-08-24 ENCOUNTER — Encounter (INDEPENDENT_AMBULATORY_CARE_PROVIDER_SITE_OTHER): Payer: Self-pay | Admitting: Bariatrics

## 2020-08-25 ENCOUNTER — Other Ambulatory Visit (INDEPENDENT_AMBULATORY_CARE_PROVIDER_SITE_OTHER): Payer: Self-pay | Admitting: Bariatrics

## 2020-08-25 DIAGNOSIS — R632 Polyphagia: Secondary | ICD-10-CM

## 2020-08-25 NOTE — Telephone Encounter (Signed)
Please review

## 2020-08-27 DIAGNOSIS — Z6836 Body mass index (BMI) 36.0-36.9, adult: Secondary | ICD-10-CM | POA: Diagnosis not present

## 2020-08-27 DIAGNOSIS — B9689 Other specified bacterial agents as the cause of diseases classified elsewhere: Secondary | ICD-10-CM | POA: Diagnosis not present

## 2020-08-27 DIAGNOSIS — J208 Acute bronchitis due to other specified organisms: Secondary | ICD-10-CM | POA: Diagnosis not present

## 2020-08-31 DIAGNOSIS — J208 Acute bronchitis due to other specified organisms: Secondary | ICD-10-CM | POA: Diagnosis not present

## 2020-08-31 DIAGNOSIS — B9689 Other specified bacterial agents as the cause of diseases classified elsewhere: Secondary | ICD-10-CM | POA: Diagnosis not present

## 2020-09-06 DIAGNOSIS — R059 Cough, unspecified: Secondary | ICD-10-CM | POA: Diagnosis not present

## 2020-09-06 DIAGNOSIS — B9689 Other specified bacterial agents as the cause of diseases classified elsewhere: Secondary | ICD-10-CM | POA: Diagnosis not present

## 2020-09-06 DIAGNOSIS — Z6836 Body mass index (BMI) 36.0-36.9, adult: Secondary | ICD-10-CM | POA: Diagnosis not present

## 2020-09-06 DIAGNOSIS — J208 Acute bronchitis due to other specified organisms: Secondary | ICD-10-CM | POA: Diagnosis not present

## 2020-09-06 DIAGNOSIS — R062 Wheezing: Secondary | ICD-10-CM | POA: Diagnosis not present

## 2020-09-07 ENCOUNTER — Encounter (INDEPENDENT_AMBULATORY_CARE_PROVIDER_SITE_OTHER): Payer: Self-pay | Admitting: Bariatrics

## 2020-09-07 ENCOUNTER — Other Ambulatory Visit: Payer: Self-pay

## 2020-09-07 ENCOUNTER — Ambulatory Visit (INDEPENDENT_AMBULATORY_CARE_PROVIDER_SITE_OTHER): Payer: BC Managed Care – PPO | Admitting: Bariatrics

## 2020-09-07 VITALS — BP 128/64 | HR 70 | Temp 97.9°F | Ht 65.0 in | Wt 220.0 lb

## 2020-09-07 DIAGNOSIS — R632 Polyphagia: Secondary | ICD-10-CM | POA: Diagnosis not present

## 2020-09-07 DIAGNOSIS — E559 Vitamin D deficiency, unspecified: Secondary | ICD-10-CM

## 2020-09-07 DIAGNOSIS — K5909 Other constipation: Secondary | ICD-10-CM | POA: Diagnosis not present

## 2020-09-07 DIAGNOSIS — Z9189 Other specified personal risk factors, not elsewhere classified: Secondary | ICD-10-CM

## 2020-09-07 DIAGNOSIS — E8881 Metabolic syndrome: Secondary | ICD-10-CM | POA: Diagnosis not present

## 2020-09-07 DIAGNOSIS — Z6839 Body mass index (BMI) 39.0-39.9, adult: Secondary | ICD-10-CM

## 2020-09-07 MED ORDER — VITAMIN D (ERGOCALCIFEROL) 1.25 MG (50000 UNIT) PO CAPS
50000.0000 [IU] | ORAL_CAPSULE | ORAL | 0 refills | Status: DC
Start: 2020-09-07 — End: 2020-09-28

## 2020-09-07 MED ORDER — OZEMPIC (0.25 OR 0.5 MG/DOSE) 2 MG/1.5ML ~~LOC~~ SOPN: 0.5000 mg | PEN_INJECTOR | SUBCUTANEOUS | 0 refills | Status: DC

## 2020-09-13 ENCOUNTER — Encounter (INDEPENDENT_AMBULATORY_CARE_PROVIDER_SITE_OTHER): Payer: Self-pay | Admitting: Bariatrics

## 2020-09-13 NOTE — Progress Notes (Signed)
Chief Complaint:   OBESITY Madeline Warren is here to discuss her progress with her obesity treatment plan along with follow-up of her obesity related diagnoses. Madeline Warren is on keeping a food journal and adhering to recommended goals of 1200 calories and 80 g protein and states she is following her eating plan approximately 50% of the time. Madeline Warren states she is not currently exercising.  Today's visit was #: 15 Starting weight: 237 lbs Starting date: 12/30/2019 Today's weight: 220 lbs Today's date: 09/07/2020 Total lbs lost to date: 17 Total lbs lost since last in-office visit: 2  Interim History: Madeline Warren is down an additional 2 lbs.  Subjective:   1. Vitamin D deficiency She is currently taking prescription vitamin D 50,000 IU each week. She denies nausea, vomiting or muscle weakness.  Lab Results  Component Value Date   VD25OH 32.5 06/21/2020   VD25OH 33.2 12/30/2019   2. Polyphagia Avian endorses excessive hunger.   3. Insulin resistance Madeline Warren is taking Ozempic. She is eating more raw vegetables.  4. Other constipation Madeline Warren reports hard stools with Ozempic.  5. At risk for nausea Madeline Warren is at risk for nausea.  Assessment/Plan:   1. Vitamin D deficiency Low Vitamin D level contributes to fatigue and are associated with obesity, breast, and colon cancer. She agrees to continue to take prescription Vitamin D @50 ,000 IU every week and will follow-up for routine testing of Vitamin D, at least 2-3 times per year to avoid over-replacement.  Refill- Vitamin D, Ergocalciferol, (DRISDOL) 1.25 MG (50000 UNIT) CAPS capsule; Take 1 capsule (50,000 Units total) by mouth every 7 (seven) days.  Dispense: 4 capsule; Refill: 0  2. Polyphagia Intensive lifestyle modifications are the first line treatment for this issue. We discussed several lifestyle modifications today and she will continue to work on diet, exercise and weight loss efforts. Orders and follow up as documented in  patient record.  Counseling Polyphagia is excessive hunger. Causes can include: low blood sugars, hypERthyroidism, PMS, lack of sleep, stress, insulin resistance, diabetes, certain medications, and diets that are deficient in protein and fiber.   Refill- Semaglutide,0.25 or 0.5MG /DOS, (OZEMPIC, 0.25 OR 0.5 MG/DOSE,) 2 MG/1.5ML SOPN; Inject 0.5 mg into the skin once a week.  Dispense: 1.5 mL; Refill: 0  3. Insulin resistance Madeline Warren will continue to work on weight loss, exercise, and decreasing simple carbohydrates to help decrease the risk of diabetes. Madeline Warren agreed to follow-up with [place] as directed to closely monitor her progress. Continue Ozempic.  4. Other constipation Madeline Warren was informed that a decrease in bowel movement frequency is normal while losing weight, but stools should not be hard or painful. Orders and follow up as documented in patient record.  Take Miralax with water BID.  Use stool suppositories.  Use Dulcolax.  Counseling Getting to Good Bowel Health: Your goal is to have one soft bowel movement each day. Drink at least 8 glasses of water each day. Eat plenty of fiber (goal is over 25 grams each day). It is best to get most of your fiber from dietary sources which includes leafy green vegetables, fresh fruit, and whole grains. You may need to add fiber with the help of OTC fiber supplements. These include Metamucil, Citrucel, and Flaxseed. If you are still having trouble, try adding Miralax or Magnesium Citrate. If all of these changes do not work, Cabin crew.  5. At risk for nausea Madeline Warren was given approximately 15 minutes of nausea prevention counseling today. Madeline Warren is at  risk for nausea due to her new or current medication. She was encouraged to titrate her medication slowly, make sure to stay hydrated, eat smaller portions throughout the day, and avoid high fat meals.    6. Obesity current BMI 36.6  Madeline Warren is currently in the action stage of  change. As such, her goal is to continue with weight loss efforts. She has agreed to keeping a food journal and adhering to recommended goals of 1200 calories and 80 g protein.   Pt will stay closely adherent to the plan.  Exercise goals: No exercise has been prescribed at this time. Bone in heel cracked and pt is now in a boot for fractured heel.  Behavioral modification strategies: increasing lean protein intake, decreasing simple carbohydrates, increasing vegetables, increasing water intake, decreasing eating out, no skipping meals, meal planning and cooking strategies, keeping healthy foods in the home, and planning for success.  Madeline Warren has agreed to follow-up with our clinic in 2-3 weeks. She was informed of the importance of frequent follow-up visits to maximize her success with intensive lifestyle modifications for her multiple health conditions.   Objective:   Pulse 70, temperature 97.9 F (36.6 C), height 5\' 5"  (1.651 m), weight 220 lb (99.8 kg), last menstrual period 12/22/2013, SpO2 97 %. Body mass index is 36.61 kg/m.  General: Cooperative, alert, well developed, in no acute distress. HEENT: Conjunctivae and lids unremarkable. Cardiovascular: Regular rhythm.  Lungs: Normal work of breathing. Neurologic: No focal deficits.   Lab Results  Component Value Date   CREATININE 0.77 06/21/2020   BUN 12 06/21/2020   NA 142 06/21/2020   K 4.8 06/21/2020   CL 104 06/21/2020   CO2 22 06/21/2020   Lab Results  Component Value Date   ALT 13 06/21/2020   AST 15 06/21/2020   ALKPHOS 70 06/21/2020   BILITOT 0.4 06/21/2020   Lab Results  Component Value Date   HGBA1C 5.2 06/21/2020   HGBA1C 5.6 12/30/2019   Lab Results  Component Value Date   INSULIN 13.1 06/21/2020   INSULIN 17.3 12/30/2019   Lab Results  Component Value Date   TSH 3.190 06/21/2020   Lab Results  Component Value Date   CHOL 223 (H) 06/21/2020   HDL 46 06/21/2020   LDLCALC 159 (H) 06/21/2020    TRIG 101 06/21/2020   Lab Results  Component Value Date   VD25OH 32.5 06/21/2020   VD25OH 33.2 12/30/2019   Lab Results  Component Value Date   WBC 10.6 (H) 01/07/2014   HGB 10.5 (L) 01/07/2014   HCT 33.5 (L) 01/07/2014   MCV 80.3 01/07/2014   PLT 287 01/07/2014   No results found for: IRON, TIBC, FERRITIN  Attestation Statements:   Reviewed by clinician on day of visit: allergies, medications, problem list, medical history, surgical history, family history, social history, and previous encounter notes.  Coral Ceo, CMA, am acting as Location manager for [employer], DO.  I have reviewed the above documentation for accuracy and completeness, and I agree with the above. Jearld Lesch, DO

## 2020-09-16 DIAGNOSIS — S92131D Displaced fracture of posterior process of right talus, subsequent encounter for fracture with routine healing: Secondary | ICD-10-CM | POA: Diagnosis not present

## 2020-09-28 ENCOUNTER — Other Ambulatory Visit: Payer: Self-pay

## 2020-09-28 ENCOUNTER — Encounter (INDEPENDENT_AMBULATORY_CARE_PROVIDER_SITE_OTHER): Payer: Self-pay | Admitting: Bariatrics

## 2020-09-28 ENCOUNTER — Ambulatory Visit (INDEPENDENT_AMBULATORY_CARE_PROVIDER_SITE_OTHER): Payer: BC Managed Care – PPO | Admitting: Bariatrics

## 2020-09-28 VITALS — BP 121/83 | HR 71 | Temp 97.7°F | Ht 65.0 in | Wt 217.0 lb

## 2020-09-28 DIAGNOSIS — R632 Polyphagia: Secondary | ICD-10-CM | POA: Diagnosis not present

## 2020-09-28 DIAGNOSIS — Z9189 Other specified personal risk factors, not elsewhere classified: Secondary | ICD-10-CM | POA: Diagnosis not present

## 2020-09-28 DIAGNOSIS — Z6839 Body mass index (BMI) 39.0-39.9, adult: Secondary | ICD-10-CM

## 2020-09-28 DIAGNOSIS — E559 Vitamin D deficiency, unspecified: Secondary | ICD-10-CM

## 2020-09-28 MED ORDER — OZEMPIC (0.25 OR 0.5 MG/DOSE) 2 MG/1.5ML ~~LOC~~ SOPN: 0.5000 mg | PEN_INJECTOR | SUBCUTANEOUS | 0 refills | Status: DC

## 2020-09-28 MED ORDER — ONDANSETRON HCL 4 MG PO TABS: 4.0000 mg | ORAL_TABLET | Freq: Three times a day (TID) | ORAL | 0 refills | Status: DC | PRN

## 2020-09-28 MED ORDER — VITAMIN D (ERGOCALCIFEROL) 1.25 MG (50000 UNIT) PO CAPS: 50000.0000 [IU] | ORAL_CAPSULE | ORAL | 0 refills | Status: DC

## 2020-10-05 ENCOUNTER — Encounter (INDEPENDENT_AMBULATORY_CARE_PROVIDER_SITE_OTHER): Payer: Self-pay | Admitting: Bariatrics

## 2020-10-05 NOTE — Progress Notes (Signed)
Chief Complaint:   OBESITY Madeline Warren is here to discuss her progress with her obesity treatment plan along with follow-up of her obesity related diagnoses. Madeline Warren is on keeping a food journal and adhering to recommended goals of 1200 calories and 80 grams of protein and states she is following her eating plan approximately 70% of the time. Madeline Warren states she is doing arms/thighs for 7 minutes 5 times per week.  Today's visit was #: 27 Starting weight: 237 lbs Starting date: 12/30/2019 Today's weight: 217 lbs Today's date: 09/28/2020 Total lbs lost to date: 20 lbs Total lbs lost since last in-office visit: 3 lbs  Interim History: Madeline Warren is down an additional 3 lbs since her last visit.  Subjective:   1. Vitamin D deficiency Madeline Warren denies muscles weakness and nausea and vomiting.  2. Madeline Warren endorses excessive hunger.    3. At risk for dehydration Madeline Warren is at risk for dehydration due to weather, obesity and exercise.  Assessment/Plan:   1. Vitamin D deficiency Low Vitamin D level contributes to fatigue and are associated with obesity, breast, and colon cancer.We will refill Vitamin D for 1 month with no refills and Madeline Warren agrees to continue to take prescription Vitamin D 50,000 IU every week and will follow-up for routine testing of Vitamin D, at least 2-3 times per year to avoid over-replacement.  - Vitamin D, Ergocalciferol, (DRISDOL) 1.25 MG (50000 UNIT) CAPS capsule; Take 1 capsule (50,000 Units total) by mouth every 7 (seven) days.  Dispense: 4 capsule; Refill: 0  2. Polyphagia We discussed several lifestyle modifications today and she will continue to work on diet, exercise and weight loss efforts. Orders and follow up as documented in patient record. We will refill Madeline Warren's Ozempic 0.5 mg weekly for 1 month with no refills. We will refill Zofran 4 mg for 1 month with no refills.Intensive lifestyle modifications are the first line treatment for this issue.    Counseling Polyphagia is excessive hunger. Causes can include: low blood sugars, hypERthyroidism, PMS, lack of sleep, stress, insulin resistance, diabetes, certain medications, and diets that are deficient in protein and fiber.    - ondansetron (ZOFRAN) 4 MG tablet; Take 1 tablet (4 mg total) by mouth every 8 (eight) hours as needed for nausea or vomiting.  Dispense: 20 tablet; Refill: 0  - Semaglutide,0.25 or 0.'5MG'$ /DOS, (OZEMPIC, 0.25 OR 0.5 MG/DOSE,) 2 MG/1.5ML SOPN; Inject 0.5 mg into the skin once a week.  Dispense: 1.5 mL; Refill: 0  3. At risk for dehydration Madeline Warren was given approximately 15 minutes dehydration prevention counseling today. Madeline Warren is at risk for dehydration due to weight loss and current medication(s). She was encouraged to hydrate and monitor fluid status to avoid dehydration as well as weight loss plateaus.    4. Obesity current BMI 36.1 Madeline Warren is currently in the action stage of change. As such, her goal is to continue with weight loss efforts. She has agreed to keeping a food journal and adhering to recommended goals of 1200 calories and 80 grams of protein.   Madeline Warren will continue to meal plan. She will continue intentional eating. She will stick with the number of calories and proteins. Exercise goals:  As is.  Behavioral modification strategies: increasing lean protein intake, decreasing simple carbohydrates, increasing vegetables, increasing water intake, decreasing eating out, no skipping meals, meal planning and cooking strategies, keeping healthy foods in the home, and planning for success.  Madeline Warren has agreed to follow-up with our clinic in 2-3 weeks. She was  informed of the importance of frequent follow-up visits to maximize her success with intensive lifestyle modifications for her multiple health conditions.   Objective:   Blood pressure 121/83, pulse 71, temperature 97.7 F (36.5 C), height '5\' 5"'$  (1.651 m), weight 217 lb (98.4 kg), last menstrual  period 12/22/2013, SpO2 98 %. Body mass index is 36.11 kg/m.  General: Cooperative, alert, well developed, in no acute distress. HEENT: Conjunctivae and lids unremarkable. Cardiovascular: Regular rhythm.  Lungs: Normal work of breathing. Neurologic: No focal deficits.   Lab Results  Component Value Date   CREATININE 0.77 06/21/2020   BUN 12 06/21/2020   NA 142 06/21/2020   K 4.8 06/21/2020   CL 104 06/21/2020   CO2 22 06/21/2020   Lab Results  Component Value Date   ALT 13 06/21/2020   AST 15 06/21/2020   ALKPHOS 70 06/21/2020   BILITOT 0.4 06/21/2020   Lab Results  Component Value Date   HGBA1C 5.2 06/21/2020   HGBA1C 5.6 12/30/2019   Lab Results  Component Value Date   INSULIN 13.1 06/21/2020   INSULIN 17.3 12/30/2019   Lab Results  Component Value Date   TSH 3.190 06/21/2020   Lab Results  Component Value Date   CHOL 223 (H) 06/21/2020   HDL 46 06/21/2020   LDLCALC 159 (H) 06/21/2020   TRIG 101 06/21/2020   Lab Results  Component Value Date   VD25OH 32.5 06/21/2020   VD25OH 33.2 12/30/2019   Lab Results  Component Value Date   WBC 10.6 (H) 01/07/2014   HGB 10.5 (L) 01/07/2014   HCT 33.5 (L) 01/07/2014   MCV 80.3 01/07/2014   PLT 287 01/07/2014   No results found for: IRON, TIBC, FERRITIN   Reviewed by clinician on day of visit: allergies, medications, problem list, medical history, surgical history, family history, social history, and previous encounter notes.  I, Madeline Warren, RMA, am acting as Location manager for [employer], DO.   I have reviewed the above documentation for accuracy and completeness, and I agree with the above. Madeline Lesch, DO

## 2020-10-12 ENCOUNTER — Ambulatory Visit (INDEPENDENT_AMBULATORY_CARE_PROVIDER_SITE_OTHER): Payer: BC Managed Care – PPO | Admitting: Bariatrics

## 2020-10-12 ENCOUNTER — Encounter (INDEPENDENT_AMBULATORY_CARE_PROVIDER_SITE_OTHER): Payer: Self-pay | Admitting: Bariatrics

## 2020-10-12 ENCOUNTER — Other Ambulatory Visit: Payer: Self-pay

## 2020-10-12 VITALS — BP 118/83 | HR 66 | Temp 98.1°F | Ht 65.0 in | Wt 216.0 lb

## 2020-10-12 DIAGNOSIS — Z9189 Other specified personal risk factors, not elsewhere classified: Secondary | ICD-10-CM | POA: Diagnosis not present

## 2020-10-12 DIAGNOSIS — Z6839 Body mass index (BMI) 39.0-39.9, adult: Secondary | ICD-10-CM | POA: Diagnosis not present

## 2020-10-12 DIAGNOSIS — E8881 Metabolic syndrome: Secondary | ICD-10-CM | POA: Diagnosis not present

## 2020-10-12 DIAGNOSIS — R632 Polyphagia: Secondary | ICD-10-CM | POA: Diagnosis not present

## 2020-10-12 MED ORDER — SEMAGLUTIDE (1 MG/DOSE) 4 MG/3ML ~~LOC~~ SOPN: 1.0000 mg | PEN_INJECTOR | SUBCUTANEOUS | 0 refills | Status: DC

## 2020-10-13 ENCOUNTER — Encounter (INDEPENDENT_AMBULATORY_CARE_PROVIDER_SITE_OTHER): Payer: Self-pay | Admitting: Bariatrics

## 2020-10-13 NOTE — Progress Notes (Signed)
Chief Complaint:   OBESITY Madeline Warren is here to discuss her progress with her obesity treatment plan along with follow-up of her obesity related diagnoses. Madeline Warren is on keeping a food journal and adhering to recommended goals of 1200 calories and 80 grams of protein and states she is following her eating plan approximately 50% of the time. Madeline Warren states she is walking for 10 minutes 3 times per week.  Today's visit was #: 13 Starting weight: 237 lbs Starting date: 12/30/2019 Today's weight: 216 lbs Today's date:10/12/2020 Total lbs lost to date: 21 lbs Total lbs lost since last in-office visit: 1 lb  Interim History: Madeline Warren is down 1 lb.  Subjective:   1. Polyphagia Chana has Zofran.  2. Insulin resistance Yamna will limit carbohydrates.  3. At risk for side effect of medication Annajulia is at risk for side effect of medication due increase dosage of Ozempic.  Assessment/Plan:   1. Polyphagia We will refill Ozempic 1 mg for 1 month with no refills. Shanicia agrees to increase Ozempic from 0.5 mg to 1.0 mg.Intensive lifestyle modifications are the first line treatment for this issue. We discussed several lifestyle modifications today and she will continue to work on diet, exercise and weight loss efforts. Orders and follow up as documented in patient record.  Counseling Polyphagia is excessive hunger. Causes can include: low blood sugars, hypERthyroidism, PMS, lack of sleep, stress, insulin resistance, diabetes, certain medications, and diets that are deficient in protein and fiber.    - Semaglutide, 1 MG/DOSE, 4 MG/3ML SOPN; Inject 1 mg as directed once a week.  Dispense: 3 mL; Refill: 0  2. Insulin resistance Madeline Warren will continue to work on weight loss, exercise, and cutting carbohydrates. She will increase healthy fruits and vegetables. Madeline Warren agreed to follow-up with [place] as directed to closely monitor her progress.  - Semaglutide, 1 MG/DOSE, 4 MG/3ML SOPN; Inject  1 mg as directed once a week.  Dispense: 3 mL; Refill: 0  3. At risk for side effect of medication Madeline Warren was given approximately 15 minutes of drug side effect counseling today.  We discussed side effect possibility and risk versus benefits. Madeline Warren agreed to the medication and will contact this office if these side effects are intolerable.  Repetitive spaced learning was employed today to elicit superior memory formation and behavioral change.   - Semaglutide, 1 MG/DOSE, 4 MG/3ML SOPN; Inject 1 mg as directed once a week.  Dispense: 3 mL; Refill: 0  4. Obesity current BMI 35.9 Madeline Warren is currently in the action stage of change. As such, her goal is to continue with weight loss efforts. She has agreed to keeping a food journal and adhering to recommended goals of 1200 calories and 80 grams of protein.   Hurley Cisco will continue meal planning. She will continue intentional eating.  - Semaglutide, 1 MG/DOSE, 4 MG/3ML SOPN; Inject 1 mg as directed once a week.  Dispense: 3 mL; Refill: 0  Exercise goals: No exercise has been prescribed at this time.  Behavioral modification strategies: increasing lean protein intake, decreasing simple carbohydrates, increasing vegetables, increasing water intake, decreasing eating out, no skipping meals, meal planning and cooking strategies, keeping healthy foods in the home, and planning for success.  Madeline Warren has agreed to follow-up with our clinic in 3 weeks (fasting). She was informed of the importance of frequent follow-up visits to maximize her success with intensive lifestyle modifications for her multiple health conditions.   Objective:   Blood pressure 118/83, pulse 66, temperature 98.1  F (36.7 C), height '5\' 5"'$  (1.651 m), weight 216 lb (98 kg), last menstrual period 12/22/2013, SpO2 99 %. Body mass index is 35.94 kg/m.  General: Cooperative, alert, well developed, in no acute distress. HEENT: Conjunctivae and lids unremarkable. Cardiovascular:  Regular rhythm.  Lungs: Normal work of breathing. Neurologic: No focal deficits.   Lab Results  Component Value Date   CREATININE 0.77 06/21/2020   BUN 12 06/21/2020   NA 142 06/21/2020   K 4.8 06/21/2020   CL 104 06/21/2020   CO2 22 06/21/2020   Lab Results  Component Value Date   ALT 13 06/21/2020   AST 15 06/21/2020   ALKPHOS 70 06/21/2020   BILITOT 0.4 06/21/2020   Lab Results  Component Value Date   HGBA1C 5.2 06/21/2020   HGBA1C 5.6 12/30/2019   Lab Results  Component Value Date   INSULIN 13.1 06/21/2020   INSULIN 17.3 12/30/2019   Lab Results  Component Value Date   TSH 3.190 06/21/2020   Lab Results  Component Value Date   CHOL 223 (H) 06/21/2020   HDL 46 06/21/2020   LDLCALC 159 (H) 06/21/2020   TRIG 101 06/21/2020   Lab Results  Component Value Date   VD25OH 32.5 06/21/2020   VD25OH 33.2 12/30/2019   Lab Results  Component Value Date   WBC 10.6 (H) 01/07/2014   HGB 10.5 (L) 01/07/2014   HCT 33.5 (L) 01/07/2014   MCV 80.3 01/07/2014   PLT 287 01/07/2014   No results found for: IRON, TIBC, FERRITIN  Attestation Statements:   Reviewed by clinician on day of visit: allergies, medications, problem list, medical history, surgical history, family history, social history, and previous encounter notes.  I, Lizbeth Bark, RMA, am acting as Location manager for [employer], DO.   I have reviewed the above documentation for accuracy and completeness, and I agree with the above. Jearld Lesch, DO

## 2020-11-01 ENCOUNTER — Other Ambulatory Visit (INDEPENDENT_AMBULATORY_CARE_PROVIDER_SITE_OTHER): Payer: Self-pay | Admitting: Bariatrics

## 2020-11-01 DIAGNOSIS — K529 Noninfective gastroenteritis and colitis, unspecified: Secondary | ICD-10-CM | POA: Diagnosis not present

## 2020-11-01 DIAGNOSIS — R109 Unspecified abdominal pain: Secondary | ICD-10-CM | POA: Diagnosis not present

## 2020-11-01 DIAGNOSIS — E860 Dehydration: Secondary | ICD-10-CM | POA: Diagnosis not present

## 2020-11-01 DIAGNOSIS — R112 Nausea with vomiting, unspecified: Secondary | ICD-10-CM | POA: Diagnosis not present

## 2020-11-01 DIAGNOSIS — E6609 Other obesity due to excess calories: Secondary | ICD-10-CM | POA: Diagnosis not present

## 2020-11-01 DIAGNOSIS — R1084 Generalized abdominal pain: Secondary | ICD-10-CM | POA: Diagnosis not present

## 2020-11-01 DIAGNOSIS — Z79899 Other long term (current) drug therapy: Secondary | ICD-10-CM | POA: Diagnosis not present

## 2020-11-01 DIAGNOSIS — R632 Polyphagia: Secondary | ICD-10-CM

## 2020-11-01 DIAGNOSIS — Z6834 Body mass index (BMI) 34.0-34.9, adult: Secondary | ICD-10-CM | POA: Diagnosis not present

## 2020-11-01 DIAGNOSIS — K219 Gastro-esophageal reflux disease without esophagitis: Secondary | ICD-10-CM | POA: Diagnosis not present

## 2020-11-01 NOTE — Telephone Encounter (Signed)
Dr.Brown 

## 2020-11-02 ENCOUNTER — Ambulatory Visit (INDEPENDENT_AMBULATORY_CARE_PROVIDER_SITE_OTHER): Payer: BC Managed Care – PPO | Admitting: Bariatrics

## 2020-11-02 DIAGNOSIS — K529 Noninfective gastroenteritis and colitis, unspecified: Secondary | ICD-10-CM | POA: Diagnosis not present

## 2020-11-02 DIAGNOSIS — R112 Nausea with vomiting, unspecified: Secondary | ICD-10-CM | POA: Diagnosis not present

## 2020-11-02 DIAGNOSIS — K219 Gastro-esophageal reflux disease without esophagitis: Secondary | ICD-10-CM | POA: Diagnosis not present

## 2020-11-02 DIAGNOSIS — R1011 Right upper quadrant pain: Secondary | ICD-10-CM | POA: Diagnosis not present

## 2020-11-02 DIAGNOSIS — E860 Dehydration: Secondary | ICD-10-CM | POA: Diagnosis not present

## 2020-11-02 DIAGNOSIS — R109 Unspecified abdominal pain: Secondary | ICD-10-CM | POA: Diagnosis not present

## 2020-11-03 DIAGNOSIS — K219 Gastro-esophageal reflux disease without esophagitis: Secondary | ICD-10-CM | POA: Diagnosis not present

## 2020-11-03 DIAGNOSIS — K529 Noninfective gastroenteritis and colitis, unspecified: Secondary | ICD-10-CM | POA: Diagnosis not present

## 2020-11-03 DIAGNOSIS — E860 Dehydration: Secondary | ICD-10-CM | POA: Diagnosis not present

## 2020-11-09 ENCOUNTER — Ambulatory Visit (INDEPENDENT_AMBULATORY_CARE_PROVIDER_SITE_OTHER): Payer: BC Managed Care – PPO | Admitting: Bariatrics

## 2020-11-09 ENCOUNTER — Encounter (INDEPENDENT_AMBULATORY_CARE_PROVIDER_SITE_OTHER): Payer: Self-pay | Admitting: Bariatrics

## 2020-11-09 ENCOUNTER — Other Ambulatory Visit: Payer: Self-pay

## 2020-11-09 VITALS — BP 102/72 | HR 65 | Temp 98.0°F | Ht 65.0 in | Wt 208.0 lb

## 2020-11-09 DIAGNOSIS — Z6839 Body mass index (BMI) 39.0-39.9, adult: Secondary | ICD-10-CM

## 2020-11-09 DIAGNOSIS — E559 Vitamin D deficiency, unspecified: Secondary | ICD-10-CM

## 2020-11-09 DIAGNOSIS — E8881 Metabolic syndrome: Secondary | ICD-10-CM

## 2020-11-09 DIAGNOSIS — Z9189 Other specified personal risk factors, not elsewhere classified: Secondary | ICD-10-CM | POA: Diagnosis not present

## 2020-11-09 DIAGNOSIS — E6609 Other obesity due to excess calories: Secondary | ICD-10-CM | POA: Diagnosis not present

## 2020-11-09 MED ORDER — VITAMIN D (ERGOCALCIFEROL) 1.25 MG (50000 UNIT) PO CAPS: 50000.0000 [IU] | ORAL_CAPSULE | ORAL | 0 refills | Status: DC

## 2020-11-09 MED ORDER — OZEMPIC (0.25 OR 0.5 MG/DOSE) 2 MG/1.5ML ~~LOC~~ SOPN: 0.5000 mg | PEN_INJECTOR | SUBCUTANEOUS | 0 refills | Status: DC

## 2020-11-10 NOTE — Progress Notes (Signed)
Chief Complaint:   OBESITY Madeline Warren is here to discuss her progress with her obesity treatment plan along with follow-up of her obesity related diagnoses. Madeline Warren is on keeping a food journal and adhering to recommended goals of 1200 calories and 80 grams of protein and states she is following her eating plan approximately 50% of the time. Madeline Warren states she is doing 0 minutes 0 times per week.  Today's visit was #: 18 Starting weight: 237 lbs Starting date: 12/30/2019 Today's weight: 208 lbs Today's date: 11/09/2020 Total lbs lost to date: 29 lbs Total lbs lost since last in-office visit: 8 lbs  Interim History: Madeline Warren is down 8 lbs since her last visit. She went to the hospital on 11/02/2020 with abdominal pain, nausea and vomiting. Her prescriptions are Protonix and Zofran.  Subjective:   1. Vitamin D deficiency Madeline Warren is currently taking her medication as directed.  2. Insulin resistance Madeline Warren is currently taking Ozempic.  3. At risk for side effect of medication Madeline Warren is at risk for side effect of medication due to nausea, vomiting and constipation.  Assessment/Plan:   1. Vitamin D deficiency Low Vitamin D level contributes to fatigue and are associated with obesity, breast, and colon cancer. We will refill prescription Vitamin D 50,000 IU every week for 1 month with no refills and Madeline Warren will follow-up for routine testing of Vitamin D, at least 2-3 times per year to avoid over-replacement.  - Vitamin D, Ergocalciferol, (DRISDOL) 1.25 MG (50000 UNIT) CAPS capsule; Take 1 capsule (50,000 Units total) by mouth every 7 (seven) days.  Dispense: 4 capsule; Refill: 0  2. Insulin resistance Madeline Warren agrees to start a new prescription of Ozempic 0.5 mg. We will fill Ozempic 0.5 mg weekly for 1 month with no refills. She will continue to work on weight loss, exercise, and decreasing simple carbohydrates to help decrease the risk of diabetes. Madeline Warren agreed to follow-up with  Madeline Warren as directed to closely monitor her progress.  - Semaglutide,0.25 or 0.'5MG'$ /DOS, (OZEMPIC, 0.25 OR 0.5 MG/DOSE,) 2 MG/1.5ML SOPN; Inject 0.5 mg into the skin once a week.  Dispense: 1.5 mL; Refill: 0  3. At risk for side effect of medication Madeline Warren was given approximately 15 minutes of drug side effect counseling today.  We discussed side effect possibility and risk versus benefits. Madeline Warren agreed to the medication and will contact this office if these side effects are intolerable.  Repetitive spaced learning was employed today to elicit superior memory formation and behavioral change.   4. Obesity, BMI today 34.7 Madeline Warren is currently in the action stage of change. As such, her goal is to continue with weight loss efforts. She has agreed to keeping a food journal and adhering to recommended goals of 1200 calories and 80 grams of protein.    She will decrease fats and  soft drinks.   Exercise goals: No exercise has been prescribed at this time.  Behavioral modification strategies: increasing lean protein intake, decreasing simple carbohydrates, increasing vegetables, increasing water intake, decreasing eating out, no skipping meals, meal planning and cooking strategies, keeping healthy foods in the home, and planning for success.  Madeline Warren has agreed to follow-up with our clinic in 3-4 weeks(fasting). She was informed of the importance of frequent follow-up visits to maximize her success with intensive lifestyle modifications for her multiple health conditions.   Objective:   Blood pressure 102/72, pulse 65, temperature 98 F (36.7 C), height '5\' 5"'$  (1.651 m), weight 208 lb (94.3 kg), last menstrual period  12/22/2013, SpO2 100 %. Body mass index is 34.61 kg/m.  General: Cooperative, alert, well developed, in no acute distress. HEENT: Conjunctivae and lids unremarkable. Cardiovascular: Regular rhythm.  Lungs: Normal work of breathing. Neurologic: No focal deficits.   Lab Results   Component Value Date   CREATININE 0.77 06/21/2020   BUN 12 06/21/2020   NA 142 06/21/2020   K 4.8 06/21/2020   CL 104 06/21/2020   CO2 22 06/21/2020   Lab Results  Component Value Date   ALT 13 06/21/2020   AST 15 06/21/2020   ALKPHOS 70 06/21/2020   BILITOT 0.4 06/21/2020   Lab Results  Component Value Date   HGBA1C 5.2 06/21/2020   HGBA1C 5.6 12/30/2019   Lab Results  Component Value Date   INSULIN 13.1 06/21/2020   INSULIN 17.3 12/30/2019   Lab Results  Component Value Date   TSH 3.190 06/21/2020   Lab Results  Component Value Date   CHOL 223 (H) 06/21/2020   HDL 46 06/21/2020   LDLCALC 159 (H) 06/21/2020   TRIG 101 06/21/2020   Lab Results  Component Value Date   VD25OH 32.5 06/21/2020   VD25OH 33.2 12/30/2019   Lab Results  Component Value Date   WBC 10.6 (H) 01/07/2014   HGB 10.5 (L) 01/07/2014   HCT 33.5 (L) 01/07/2014   MCV 80.3 01/07/2014   PLT 287 01/07/2014   No results found for: IRON, TIBC, FERRITIN  Attestation Statements:   Reviewed by clinician on day of visit: allergies, medications, problem list, medical history, surgical history, family history, social history, and previous encounter notes.   I, Madeline Warren, RMA, am acting as Location manager for [employer], DO.   I have reviewed the above documentation for accuracy and completeness, and I agree with the above. Madeline Lesch, DO

## 2020-11-18 DIAGNOSIS — R112 Nausea with vomiting, unspecified: Secondary | ICD-10-CM | POA: Diagnosis not present

## 2020-11-18 DIAGNOSIS — R109 Unspecified abdominal pain: Secondary | ICD-10-CM | POA: Diagnosis not present

## 2020-11-18 DIAGNOSIS — E860 Dehydration: Secondary | ICD-10-CM | POA: Diagnosis not present

## 2020-11-18 DIAGNOSIS — K529 Noninfective gastroenteritis and colitis, unspecified: Secondary | ICD-10-CM | POA: Diagnosis not present

## 2020-11-18 DIAGNOSIS — Z1331 Encounter for screening for depression: Secondary | ICD-10-CM | POA: Diagnosis not present

## 2020-12-07 ENCOUNTER — Other Ambulatory Visit: Payer: Self-pay

## 2020-12-07 ENCOUNTER — Encounter (INDEPENDENT_AMBULATORY_CARE_PROVIDER_SITE_OTHER): Payer: Self-pay | Admitting: Bariatrics

## 2020-12-07 ENCOUNTER — Ambulatory Visit (INDEPENDENT_AMBULATORY_CARE_PROVIDER_SITE_OTHER): Payer: BC Managed Care – PPO | Admitting: Bariatrics

## 2020-12-07 VITALS — BP 117/79 | HR 67 | Temp 97.8°F | Ht 65.0 in | Wt 208.0 lb

## 2020-12-07 DIAGNOSIS — E559 Vitamin D deficiency, unspecified: Secondary | ICD-10-CM | POA: Diagnosis not present

## 2020-12-07 DIAGNOSIS — E8881 Metabolic syndrome: Secondary | ICD-10-CM

## 2020-12-07 DIAGNOSIS — Z6839 Body mass index (BMI) 39.0-39.9, adult: Secondary | ICD-10-CM

## 2020-12-07 DIAGNOSIS — E7800 Pure hypercholesterolemia, unspecified: Secondary | ICD-10-CM | POA: Diagnosis not present

## 2020-12-07 DIAGNOSIS — Z9189 Other specified personal risk factors, not elsewhere classified: Secondary | ICD-10-CM

## 2020-12-07 MED ORDER — VITAMIN D (ERGOCALCIFEROL) 1.25 MG (50000 UNIT) PO CAPS
50000.0000 [IU] | ORAL_CAPSULE | ORAL | 0 refills | Status: DC
Start: 2020-12-07 — End: 2021-01-09

## 2020-12-07 MED ORDER — SEMAGLUTIDE (1 MG/DOSE) 4 MG/3ML ~~LOC~~ SOPN: 1.0000 mg | PEN_INJECTOR | SUBCUTANEOUS | 0 refills | Status: DC

## 2020-12-07 NOTE — Progress Notes (Signed)
Chief Complaint:   OBESITY Madeline Warren is here to discuss her progress with her obesity treatment plan along with follow-up of her obesity related diagnoses. Madeline Warren is on keeping a food journal and adhering to recommended goals of 1200 calories and 80 grams of protein and states she is following her eating plan approximately 60-70% of the time. Madeline Warren states she is walking for 15 minutes 2 times per week.  Today's visit was #: 86 Starting weight: 237 lbs Starting date: 12/30/2019 Today's weight: 208 lbs Today's date: 12/07/2020 Total lbs lost to date: 29 lbs Total lbs lost since last in-office visit: 0  Interim History: Madeline Warren's weight remains the same.  Subjective:   1. Insulin resistance Madeline Warren is currently taking Ozempic 0.5 mg.   2. Vitamin D deficiency Madeline Warren is taking her medications as directed.   3. Elevated cholesterol Madeline Warren is not on medications currently.   4. At risk for diabetes mellitus Madeline Warren is at risk for diabetes mellitus due to Insulin resistance.  Assessment/Plan:   1. Insulin resistance Madeline Warren will continue taking Ozempic 1 mg weekly with no refills. She will continue to work on weight loss, exercise, and decreasing simple carbohydrates to help decrease the risk of diabetes. We discussed labs (Insulin) today. Madeline Warren agreed to follow-up with [place] as directed to closely monitor her progress.  - Insulin, random  2. Vitamin D deficiency Low Vitamin D level contributes to fatigue and are associated with obesity, breast, and colon cancer. We will refill prescription Vitamin D 50,000 IU every week for 1 month with no refills and Madeline Warren will follow-up for routine testing of Vitamin D, at least 2-3 times per year to avoid over-replacement.  - Vitamin D, Ergocalciferol, (DRISDOL) 1.25 MG (50000 UNIT) CAPS capsule; Take 1 capsule (50,000 Units total) by mouth every 7 (seven) days.  Dispense: 4 capsule; Refill: 0 - VITAMIN D 25 Hydroxy (Vit-D Deficiency,  Fractures)  3. Elevated cholesterol Cardiovascular risk and specific lipid/LDL goals reviewed.  We discussed several lifestyle modifications today and Madeline Warren will continue to work on diet, exercise and weight loss efforts. We will check labs today. Orders and follow up as documented in patient record.   Counseling Intensive lifestyle modifications are the first line treatment for this issue. Dietary changes: Increase soluble fiber. Decrease simple carbohydrates. Exercise changes: Moderate to vigorous-intensity aerobic activity 150 minutes per week if tolerated. Lipid-lowering medications: see documented in medical record.  - Comprehensive metabolic panel - Lipid Panel With LDL/HDL Ratio  4. At risk for diabetes mellitus Madeline Warren was given approximately 15 minutes of diabetes education and counseling today. We discussed intensive lifestyle modifications today with an emphasis on weight loss as well as increasing exercise and decreasing simple carbohydrates in her diet. We also reviewed medication options with an emphasis on risk versus benefit of those discussed.   Repetitive spaced learning was employed today to elicit superior memory formation and behavioral change.  - Semaglutide, 1 MG/DOSE, 4 MG/3ML SOPN; Inject 1 mg as directed once a week.  Dispense: 3 mL; Refill: 0  5. Obesity current BMI 34.6 Madeline Warren is currently in the action stage of change. As such, her goal is to continue with weight loss efforts. She has agreed to keeping a food journal and adhering to recommended goals of 1200 calories and 80 grams of protein.   Madeline Warren will continue meal planning and intentional eating.  Exercise goals:  As is.  Behavioral modification strategies: increasing lean protein intake, decreasing simple carbohydrates, increasing vegetables, increasing water  intake, decreasing eating out, no skipping meals, meal planning and cooking strategies, keeping healthy foods in the home, and planning for  success.  Madeline Warren has agreed to follow-up with our clinic in 4 weeks. She was informed of the importance of frequent follow-up visits to maximize her success with intensive lifestyle modifications for her multiple health conditions.   Madeline Warren was informed we would discuss her lab results at her next visit unless there is a critical issue that needs to be addressed sooner. Madeline Warren agreed to keep her next visit at the agreed upon time to discuss these results.  Objective:   Blood pressure 117/79, pulse 67, temperature 97.8 F (36.6 C), height 5\' 5"  (1.651 m), weight 208 lb (94.3 kg), last menstrual period 12/22/2013, SpO2 98 %. Body mass index is 34.61 kg/m.  General: Cooperative, alert, well developed, in no acute distress. HEENT: Conjunctivae and lids unremarkable. Cardiovascular: Regular rhythm.  Lungs: Normal work of breathing. Neurologic: No focal deficits.   Lab Results  Component Value Date   CREATININE 0.77 06/21/2020   BUN 12 06/21/2020   NA 142 06/21/2020   K 4.8 06/21/2020   CL 104 06/21/2020   CO2 22 06/21/2020   Lab Results  Component Value Date   ALT 13 06/21/2020   AST 15 06/21/2020   ALKPHOS 70 06/21/2020   BILITOT 0.4 06/21/2020   Lab Results  Component Value Date   HGBA1C 5.2 06/21/2020   HGBA1C 5.6 12/30/2019   Lab Results  Component Value Date   INSULIN 13.1 06/21/2020   INSULIN 17.3 12/30/2019   Lab Results  Component Value Date   TSH 3.190 06/21/2020   Lab Results  Component Value Date   CHOL 223 (H) 06/21/2020   HDL 46 06/21/2020   LDLCALC 159 (H) 06/21/2020   TRIG 101 06/21/2020   Lab Results  Component Value Date   VD25OH 32.5 06/21/2020   VD25OH 33.2 12/30/2019   Lab Results  Component Value Date   WBC 10.6 (H) 01/07/2014   HGB 10.5 (L) 01/07/2014   HCT 33.5 (L) 01/07/2014   MCV 80.3 01/07/2014   PLT 287 01/07/2014   No results found for: IRON, TIBC, FERRITIN  Attestation Statements:   Reviewed by clinician on day of  visit: allergies, medications, problem list, medical history, surgical history, family history, social history, and previous encounter notes.   I, Lizbeth Bark, RMA, am acting as Location manager for [employer], DO.   I have reviewed the above documentation for accuracy and completeness, and I agree with the above. Jearld Lesch, DO

## 2020-12-08 LAB — COMPREHENSIVE METABOLIC PANEL
ALT: 16 IU/L (ref 0–32)
AST: 16 IU/L (ref 0–40)
Albumin/Globulin Ratio: 1.6 (ref 1.2–2.2)
Albumin: 4.3 g/dL (ref 3.8–4.9)
Alkaline Phosphatase: 57 IU/L (ref 44–121)
BUN/Creatinine Ratio: 13 (ref 9–23)
BUN: 9 mg/dL (ref 6–24)
Bilirubin Total: 0.5 mg/dL (ref 0.0–1.2)
CO2: 23 mmol/L (ref 20–29)
Calcium: 9.2 mg/dL (ref 8.7–10.2)
Chloride: 106 mmol/L (ref 96–106)
Creatinine, Ser: 0.71 mg/dL (ref 0.57–1.00)
Globulin, Total: 2.7 g/dL (ref 1.5–4.5)
Glucose: 83 mg/dL (ref 70–99)
Potassium: 4.5 mmol/L (ref 3.5–5.2)
Sodium: 142 mmol/L (ref 134–144)
Total Protein: 7 g/dL (ref 6.0–8.5)
eGFR: 102 mL/min/{1.73_m2} (ref >59)

## 2020-12-08 LAB — VITAMIN D 25 HYDROXY (VIT D DEFICIENCY, FRACTURES): Vit D, 25-Hydroxy: 50.4 ng/mL (ref 30.0–100.0)

## 2020-12-08 LAB — LIPID PANEL WITH LDL/HDL RATIO
Cholesterol, Total: 223 mg/dL — ABNORMAL HIGH (ref 100–199)
HDL: 51 mg/dL (ref >39)
LDL Chol Calc (NIH): 157 mg/dL — ABNORMAL HIGH (ref 0–99)
LDL/HDL Ratio: 3.1 ratio (ref 0.0–3.2)
Triglycerides: 87 mg/dL (ref 0–149)
VLDL Cholesterol Cal: 15 mg/dL (ref 5–40)

## 2020-12-08 LAB — INSULIN, RANDOM: INSULIN: 14.8 u[IU]/mL (ref 2.6–24.9)

## 2021-01-02 ENCOUNTER — Ambulatory Visit (INDEPENDENT_AMBULATORY_CARE_PROVIDER_SITE_OTHER): Payer: BC Managed Care – PPO | Admitting: Bariatrics

## 2021-01-09 ENCOUNTER — Ambulatory Visit (INDEPENDENT_AMBULATORY_CARE_PROVIDER_SITE_OTHER): Payer: BC Managed Care – PPO | Admitting: Bariatrics

## 2021-01-09 ENCOUNTER — Other Ambulatory Visit: Payer: Self-pay

## 2021-01-09 ENCOUNTER — Encounter (INDEPENDENT_AMBULATORY_CARE_PROVIDER_SITE_OTHER): Payer: Self-pay | Admitting: Bariatrics

## 2021-01-09 VITALS — BP 120/78 | HR 64 | Temp 98.2°F | Ht 65.0 in | Wt 205.0 lb

## 2021-01-09 DIAGNOSIS — E8881 Metabolic syndrome: Secondary | ICD-10-CM | POA: Diagnosis not present

## 2021-01-09 DIAGNOSIS — E559 Vitamin D deficiency, unspecified: Secondary | ICD-10-CM | POA: Diagnosis not present

## 2021-01-09 DIAGNOSIS — R632 Polyphagia: Secondary | ICD-10-CM | POA: Diagnosis not present

## 2021-01-09 DIAGNOSIS — Z6839 Body mass index (BMI) 39.0-39.9, adult: Secondary | ICD-10-CM

## 2021-01-09 MED ORDER — VITAMIN D (ERGOCALCIFEROL) 1.25 MG (50000 UNIT) PO CAPS: 50000.0000 [IU] | ORAL_CAPSULE | ORAL | 0 refills | Status: DC

## 2021-01-09 MED ORDER — SEMAGLUTIDE (1 MG/DOSE) 4 MG/3ML ~~LOC~~ SOPN: 1.0000 mg | PEN_INJECTOR | SUBCUTANEOUS | 0 refills | Status: DC

## 2021-01-09 MED ORDER — ONDANSETRON HCL 4 MG PO TABS: 4.0000 mg | ORAL_TABLET | Freq: Three times a day (TID) | ORAL | 0 refills | Status: DC | PRN

## 2021-01-09 NOTE — Progress Notes (Signed)
Chief Complaint:   OBESITY Madeline Warren is here to discuss her progress with her obesity treatment plan along with follow-up of her obesity related diagnoses. Madeline Warren is on keeping a food journal and adhering to recommended goals of 1200 calories and 80 grams of protein and states she is following her eating plan approximately 60% of the time. Madeline Warren states she is walking for 10-15 minutes 2 times per week.  Today's visit was #: 20 Starting weight: 237 lbs Starting date: 12/30/2019 Today's weight: 205 lbs Today's date: 01/09/2021 Total lbs lost to date:  Total lbs lost since last in-office visit: 32 lbs  Interim History: Madeline Warren is down an additional 3 lbs. She denies any different issues except her son is staying at his home.  Subjective:   1. Insulin resistance Madeline Warren is currently taking Semaglutide.  2. Polyphagia Madeline Warren is taking Semaglutide currently.   3. Vitamin D deficiency Madeline Warren is taking prescription Vitamin D currently.   Assessment/Plan:   1. Insulin resistance Madeline Warren will continue to work on weight loss, exercise, and decreasing simple carbohydrates to help decrease the risk of diabetes. We will refill Semaglutide 1 mg with no refills. Madeline Warren agreed to follow-up with [place] as directed to closely monitor her progress.  - Semaglutide, 1 MG/DOSE, 4 MG/3ML SOPN; Inject 1 mg as directed once a week.  Dispense: 3 mL; Refill: 0  2. Polyphagia Intensive lifestyle modifications are the first line treatment for this issue. We discussed several lifestyle modifications today and she will continue to work on diet, exercise and weight loss efforts. Madeline Warren will decrease carbohydrates and she will adhere closely to the plan. Orders and follow up as documented in patient record.  Counseling Polyphagia is excessive hunger. Causes can include: low blood sugars, hypERthyroidism, PMS, lack of sleep, stress, insulin resistance, diabetes, certain medications, and diets that are  deficient in protein and fiber.   - ondansetron (ZOFRAN) 4 MG tablet; Take 1 tablet (4 mg total) by mouth every 8 (eight) hours as needed for nausea or vomiting.  Dispense: 20 tablet; Refill: 0  3. Vitamin D deficiency Low Vitamin D level contributes to fatigue and are associated with obesity, breast, and colon cancer. We will refill prescription Vitamin D 50,000 IU every week for 1 month with no refills and Madeline Warren will follow-up for routine testing of Vitamin D, at least 2-3 times per year to avoid over-replacement.  - Vitamin D, Ergocalciferol, (DRISDOL) 1.25 MG (50000 UNIT) CAPS capsule; Take 1 capsule (50,000 Units total) by mouth every 7 (seven) days.  Dispense: 4 capsule; Refill: 0  4. Class 2 severe obesity with serious comorbidity and body mass index (BMI) of 39.0 to 39.9 in adult, unspecified obesity type [facility]) Madeline Warren is currently in the action stage of change. As such, her goal is to continue with weight loss efforts. She has agreed to keeping a food journal and adhering to recommended goals of 1200 calories and 80 grams of protein.   Madeline Warren will continue meal planning and intentional eating. I reviewed labs with Madeline Warren from 12/07/2020.   Exercise goals:  As is.  Behavioral modification strategies: increasing lean protein intake, decreasing simple carbohydrates, increasing vegetables, increasing water intake, decreasing eating out, no skipping meals, meal planning and cooking strategies, keeping healthy foods in the home, and planning for success.  Madeline Warren has agreed to follow-up with our clinic in 3-4 weeks. She was informed of the importance of frequent follow-up visits to maximize her success with intensive lifestyle modifications for her multiple  health conditions.   Objective:   Blood pressure 120/78, pulse 64, temperature 98.2 F (36.8 C), height 5\' 5"  (1.651 m), weight 205 lb (93 kg), last menstrual period 12/22/2013, SpO2 100 %. Body mass index is 34.11 kg/m.  General:  Cooperative, alert, well developed, in no acute distress. HEENT: Conjunctivae and lids unremarkable. Cardiovascular: Regular rhythm.  Lungs: Normal work of breathing. Neurologic: No focal deficits.   Lab Results  Component Value Date   CREATININE 0.71 12/07/2020   BUN 9 12/07/2020   NA 142 12/07/2020   K 4.5 12/07/2020   CL 106 12/07/2020   CO2 23 12/07/2020   Lab Results  Component Value Date   ALT 16 12/07/2020   AST 16 12/07/2020   ALKPHOS 57 12/07/2020   BILITOT 0.5 12/07/2020   Lab Results  Component Value Date   HGBA1C 5.2 06/21/2020   HGBA1C 5.6 12/30/2019   Lab Results  Component Value Date   INSULIN 14.8 12/07/2020   INSULIN 13.1 06/21/2020   INSULIN 17.3 12/30/2019   Lab Results  Component Value Date   TSH 3.190 06/21/2020   Lab Results  Component Value Date   CHOL 223 (H) 12/07/2020   HDL 51 12/07/2020   LDLCALC 157 (H) 12/07/2020   TRIG 87 12/07/2020   Lab Results  Component Value Date   VD25OH 50.4 12/07/2020   VD25OH 32.5 06/21/2020   VD25OH 33.2 12/30/2019   Lab Results  Component Value Date   WBC 10.6 (H) 01/07/2014   HGB 10.5 (L) 01/07/2014   HCT 33.5 (L) 01/07/2014   MCV 80.3 01/07/2014   PLT 287 01/07/2014   No results found for: IRON, TIBC, FERRITIN  Attestation Statements:   Reviewed by clinician on day of visit: allergies, medications, problem list, medical history, surgical history, family history, social history, and previous encounter notes.  I, Lizbeth Bark, RMA, am acting as Location manager for [employer], DO.   I have reviewed the above documentation for accuracy and completeness, and I agree with the above. Jearld Lesch, DO

## 2021-01-12 DIAGNOSIS — M722 Plantar fascial fibromatosis: Secondary | ICD-10-CM | POA: Diagnosis not present

## 2021-01-12 DIAGNOSIS — M7661 Achilles tendinitis, right leg: Secondary | ICD-10-CM | POA: Diagnosis not present

## 2021-01-30 ENCOUNTER — Encounter (INDEPENDENT_AMBULATORY_CARE_PROVIDER_SITE_OTHER): Payer: Self-pay | Admitting: Bariatrics

## 2021-01-30 ENCOUNTER — Ambulatory Visit (INDEPENDENT_AMBULATORY_CARE_PROVIDER_SITE_OTHER): Payer: BC Managed Care – PPO | Admitting: Bariatrics

## 2021-01-30 ENCOUNTER — Other Ambulatory Visit: Payer: Self-pay

## 2021-01-30 VITALS — BP 122/80 | HR 68 | Temp 97.8°F | Ht 65.0 in | Wt 204.0 lb

## 2021-01-30 DIAGNOSIS — R632 Polyphagia: Secondary | ICD-10-CM

## 2021-01-30 DIAGNOSIS — E8881 Metabolic syndrome: Secondary | ICD-10-CM | POA: Diagnosis not present

## 2021-01-30 DIAGNOSIS — E559 Vitamin D deficiency, unspecified: Secondary | ICD-10-CM | POA: Diagnosis not present

## 2021-01-30 DIAGNOSIS — Z6839 Body mass index (BMI) 39.0-39.9, adult: Secondary | ICD-10-CM

## 2021-01-30 MED ORDER — ONDANSETRON HCL 4 MG PO TABS: 4.0000 mg | ORAL_TABLET | Freq: Three times a day (TID) | ORAL | 0 refills | Status: DC | PRN

## 2021-01-30 MED ORDER — VITAMIN D (ERGOCALCIFEROL) 1.25 MG (50000 UNIT) PO CAPS: 50000.0000 [IU] | ORAL_CAPSULE | ORAL | 0 refills | Status: DC

## 2021-01-30 MED ORDER — SEMAGLUTIDE (1 MG/DOSE) 4 MG/3ML ~~LOC~~ SOPN: 1.0000 mg | PEN_INJECTOR | SUBCUTANEOUS | 0 refills | Status: DC

## 2021-01-30 NOTE — Progress Notes (Signed)
Chief Complaint:   OBESITY Madeline Warren is here to discuss her progress with her obesity treatment plan along with follow-up of her obesity related diagnoses. Madeline Warren is on keeping a food journal and adhering to recommended goals of 1200 calories and 80 grams of protein and states she is following her eating plan approximately 60% of the time. Madeline Warren states she is doing 0 minutes 0 times per week.  Today's visit was #: 21 Starting weight: 237 lbs Starting date: 12/30/2019 Today's weight: 204 lbs Today's date: 01/30/2021 Total lbs lost to date: 33 lbs Total lbs lost since last in-office visit: 1 lb  Interim History: Berry is down 1 additional pound. She states that her digestion system is working better.  Subjective:   1. Polyphagia Madeline Warren is taking her medications as directed.   2. Insulin resistance Madeline Warren is currently taking Ozempic. She will decrease carbohydrates.  3. Vitamin D deficiency Madeline Warren is taking Vitamin D currently.  Assessment/Plan:   1. Polyphagia Intensive lifestyle modifications are the first line treatment for this issue. We discussed several lifestyle modifications today and she will continue to work on diet, exercise and weight loss efforts. We will refill Ozempic 1 mg with no refills. We will refill Zofran 4 mg PRN with no refills. Orders and follow up as documented in patient record.  Counseling Polyphagia is excessive hunger. Causes can include: low blood sugars, hypERthyroidism, PMS, lack of sleep, stress, insulin resistance, diabetes, certain medications, and diets that are deficient in protein and fiber.   - ondansetron (ZOFRAN) 4 MG tablet; Take 1 tablet (4 mg total) by mouth every 8 (eight) hours as needed for nausea or vomiting.  Dispense: 20 tablet; Refill: 0 - Semaglutide, 1 MG/DOSE, 4 MG/3ML SOPN; Inject 1 mg as directed once a week.  Dispense: 3 mL; Refill: 0  2. Insulin resistance Madeline Warren will continue her medications. She will  continue to work on plan, activities and decreasing simple carbohydrates to help decrease the risk of diabetes. Madeline Warren agreed to follow-up with [place] as directed to closely monitor her progress.  3. Vitamin D deficiency Low Vitamin D level contributes to fatigue and are associated with obesity, breast, and colon cancer. We will refill prescription Vitamin D 50,000 IU every week for 1 month with no refills and Madeline Warren will follow-up for routine testing of Vitamin D, at least 2-3 times per year to avoid over-replacement.  - Vitamin D, Ergocalciferol, (DRISDOL) 1.25 MG (50000 UNIT) CAPS capsule; Take 1 capsule (50,000 Units total) by mouth every 7 (seven) days.  Dispense: 4 capsule; Refill: 0  4. Obesity with current BMI 34.0 Madeline Warren is currently in the action stage of change. As such, her goal is to continue with weight loss efforts. She has agreed to keeping a food journal and adhering to recommended goals of 1200 calories and 80 grams of protein.   Madeline Warren will continue meal planning and she will continue intentional eating. She will increase fruits and vegetables.  Exercise goals: limited due to right tendon  pain.  Behavioral modification strategies: increasing lean protein intake, decreasing simple carbohydrates, increasing vegetables, increasing water intake, decreasing eating out, no skipping meals, meal planning and cooking strategies, keeping healthy foods in the home, and planning for success.  Madeline Warren has agreed to follow-up with our clinic in 4 weeks. She was informed of the importance of frequent follow-up visits to maximize her success with intensive lifestyle modifications for her multiple health conditions.   Objective:   Blood pressure 122/80, pulse 68,  temperature 97.8 F (36.6 C), height 5\' 5"  (1.651 m), weight 204 lb (92.5 kg), last menstrual period 12/22/2013, SpO2 99 %. Body mass index is 33.95 kg/m. Madeline Warren will wear her boot for 10 days.  General: Cooperative, alert,  well developed, in no acute distress. HEENT: Conjunctivae and lids unremarkable. Cardiovascular: Regular rhythm.  Lungs: Normal work of breathing. Neurologic: No focal deficits.   Lab Results  Component Value Date   CREATININE 0.71 12/07/2020   BUN 9 12/07/2020   NA 142 12/07/2020   K 4.5 12/07/2020   CL 106 12/07/2020   CO2 23 12/07/2020   Lab Results  Component Value Date   ALT 16 12/07/2020   AST 16 12/07/2020   ALKPHOS 57 12/07/2020   BILITOT 0.5 12/07/2020   Lab Results  Component Value Date   HGBA1C 5.2 06/21/2020   HGBA1C 5.6 12/30/2019   Lab Results  Component Value Date   INSULIN 14.8 12/07/2020   INSULIN 13.1 06/21/2020   INSULIN 17.3 12/30/2019   Lab Results  Component Value Date   TSH 3.190 06/21/2020   Lab Results  Component Value Date   CHOL 223 (H) 12/07/2020   HDL 51 12/07/2020   LDLCALC 157 (H) 12/07/2020   TRIG 87 12/07/2020   Lab Results  Component Value Date   VD25OH 50.4 12/07/2020   VD25OH 32.5 06/21/2020   VD25OH 33.2 12/30/2019   Lab Results  Component Value Date   WBC 10.6 (H) 01/07/2014   HGB 10.5 (L) 01/07/2014   HCT 33.5 (L) 01/07/2014   MCV 80.3 01/07/2014   PLT 287 01/07/2014   No results found for: IRON, TIBC, FERRITIN  Attestation Statements:   Reviewed by clinician on day of visit: allergies, medications, problem list, medical history, surgical history, family history, social history, and previous encounter notes.  I, Lizbeth Bark, RMA, am acting as Location manager for [employer], DO.   I have reviewed the above documentation for accuracy and completeness, and I agree with the above. Jearld Lesch, DO

## 2021-02-07 DIAGNOSIS — M7661 Achilles tendinitis, right leg: Secondary | ICD-10-CM | POA: Diagnosis not present

## 2021-02-20 DIAGNOSIS — M7661 Achilles tendinitis, right leg: Secondary | ICD-10-CM | POA: Diagnosis not present

## 2021-02-20 DIAGNOSIS — M25571 Pain in right ankle and joints of right foot: Secondary | ICD-10-CM | POA: Diagnosis not present

## 2021-02-22 DIAGNOSIS — M25571 Pain in right ankle and joints of right foot: Secondary | ICD-10-CM | POA: Diagnosis not present

## 2021-02-22 DIAGNOSIS — M7661 Achilles tendinitis, right leg: Secondary | ICD-10-CM | POA: Diagnosis not present

## 2021-02-22 DIAGNOSIS — M7551 Bursitis of right shoulder: Secondary | ICD-10-CM | POA: Diagnosis not present

## 2021-02-23 DIAGNOSIS — E669 Obesity, unspecified: Secondary | ICD-10-CM | POA: Diagnosis not present

## 2021-02-23 DIAGNOSIS — M25511 Pain in right shoulder: Secondary | ICD-10-CM | POA: Diagnosis not present

## 2021-02-23 DIAGNOSIS — M159 Polyosteoarthritis, unspecified: Secondary | ICD-10-CM | POA: Diagnosis not present

## 2021-02-28 DIAGNOSIS — M25571 Pain in right ankle and joints of right foot: Secondary | ICD-10-CM | POA: Diagnosis not present

## 2021-02-28 DIAGNOSIS — M7661 Achilles tendinitis, right leg: Secondary | ICD-10-CM | POA: Diagnosis not present

## 2021-03-01 ENCOUNTER — Other Ambulatory Visit: Payer: Self-pay

## 2021-03-01 ENCOUNTER — Encounter (INDEPENDENT_AMBULATORY_CARE_PROVIDER_SITE_OTHER): Payer: Self-pay | Admitting: Bariatrics

## 2021-03-01 ENCOUNTER — Ambulatory Visit (INDEPENDENT_AMBULATORY_CARE_PROVIDER_SITE_OTHER): Payer: BC Managed Care – PPO | Admitting: Bariatrics

## 2021-03-01 VITALS — BP 137/82 | HR 68 | Temp 98.2°F | Ht 65.0 in | Wt 203.0 lb

## 2021-03-01 DIAGNOSIS — E8881 Metabolic syndrome: Secondary | ICD-10-CM | POA: Diagnosis not present

## 2021-03-01 DIAGNOSIS — E559 Vitamin D deficiency, unspecified: Secondary | ICD-10-CM | POA: Diagnosis not present

## 2021-03-01 DIAGNOSIS — Z6839 Body mass index (BMI) 39.0-39.9, adult: Secondary | ICD-10-CM

## 2021-03-01 MED ORDER — VITAMIN D (ERGOCALCIFEROL) 1.25 MG (50000 UNIT) PO CAPS: 50000.0000 [IU] | ORAL_CAPSULE | ORAL | 0 refills | Status: DC

## 2021-03-01 MED ORDER — SEMAGLUTIDE (1 MG/DOSE) 4 MG/3ML ~~LOC~~ SOPN: 1.0000 mg | PEN_INJECTOR | SUBCUTANEOUS | 0 refills | Status: DC

## 2021-03-01 NOTE — Progress Notes (Signed)
Chief Complaint:   OBESITY Madeline Warren is here to discuss her progress with her obesity treatment plan along with follow-up of her obesity related diagnoses. Madeline Warren is on keeping a food journal and adhering to recommended goals of 1200 calories and 80 grams of protein and states she is following her eating plan approximately 50% of the time. Madeline Warren states she is doing physical therapy for 60 minutes 2 times per week.  Today's visit was #: 22 Starting weight: 237 lbs Starting date: 12/30/2019 Today's weight: 203 lbs Today's date:03/01/2021 Total lbs lost to date: 34 lbs Total lbs lost since last in-office visit: 1 lb  Interim History: Madeline Warren is down 1 lb. She had a steroid injection.  Subjective:   1. Vitamin D deficiency Madeline Warren is currently taking Vitamin D.  2. Insulin resistance Madeline Warren is taking Semaglutide currently.  Assessment/Plan:   1. Vitamin D deficiency Low Vitamin D level contributes to fatigue and are associated with obesity, breast, and colon cancer. We will refill prescription Vitamin D 50,000 IU every week for 1 month with no refills and Madeline Warren will follow-up for routine testing of Vitamin D, at least 2-3 times per year to avoid over-replacement.  - Vitamin D, Ergocalciferol, (DRISDOL) 1.25 MG (50000 UNIT) CAPS capsule; Take 1 capsule (50,000 Units total) by mouth every 7 (seven) days.  Dispense: 4 capsule; Refill: 0  2. Insulin resistance We will refill Semaglutide for 1 month with no refills. Madeline Warren will continue to work on weight loss, exercise, and decreasing simple carbohydrates to help decrease the risk of diabetes. Madeline Warren agreed to follow-up with [place] as directed to closely monitor her progress.  - Semaglutide, 1 MG/DOSE, 4 MG/3ML SOPN; Inject 1 mg as directed once a week.  Dispense: 3 mL; Refill: 0  3. Obesity with current BMI 33.9 Madeline Warren is currently in the action stage of change. As such, her goal is to continue with weight loss efforts. She has  agreed to keeping a food journal and adhering to recommended goals of 1200 calories and 80 grams of protein.   Madeline Warren will continue meal planning and she will continue intentional eating.   Exercise goals:  Madeline Warren is doing physical therapy for her right ankle. She will decrease exercise.  Behavioral modification strategies: increasing lean protein intake, decreasing simple carbohydrates, increasing vegetables, increasing water intake, decreasing eating out, no skipping meals, meal planning and cooking strategies, keeping healthy foods in the home, and planning for success.  Madeline Warren has agreed to follow-up with our clinic in 3-4 weeks. She was informed of the importance of frequent follow-up visits to maximize her success with intensive lifestyle modifications for her multiple health conditions.   Objective:   Blood pressure 137/82, pulse 68, temperature 98.2 F (36.8 C), height 5\' 5"  (1.651 m), weight 203 lb (92.1 kg), last menstrual period 12/22/2013, SpO2 98 %. Body mass index is 33.78 kg/m.  General: Cooperative, alert, well developed, in no acute distress. HEENT: Conjunctivae and lids unremarkable. Cardiovascular: Regular rhythm.  Lungs: Normal work of breathing. Neurologic: No focal deficits.   Lab Results  Component Value Date   CREATININE 0.71 12/07/2020   BUN 9 12/07/2020   NA 142 12/07/2020   K 4.5 12/07/2020   CL 106 12/07/2020   CO2 23 12/07/2020   Lab Results  Component Value Date   ALT 16 12/07/2020   AST 16 12/07/2020   ALKPHOS 57 12/07/2020   BILITOT 0.5 12/07/2020   Lab Results  Component Value Date   HGBA1C 5.2  06/21/2020   HGBA1C 5.6 12/30/2019   Lab Results  Component Value Date   INSULIN 14.8 12/07/2020   INSULIN 13.1 06/21/2020   INSULIN 17.3 12/30/2019   Lab Results  Component Value Date   TSH 3.190 06/21/2020   Lab Results  Component Value Date   CHOL 223 (H) 12/07/2020   HDL 51 12/07/2020   LDLCALC 157 (H) 12/07/2020   TRIG 87  12/07/2020   Lab Results  Component Value Date   VD25OH 50.4 12/07/2020   VD25OH 32.5 06/21/2020   VD25OH 33.2 12/30/2019   Lab Results  Component Value Date   WBC 10.6 (H) 01/07/2014   HGB 10.5 (L) 01/07/2014   HCT 33.5 (L) 01/07/2014   MCV 80.3 01/07/2014   PLT 287 01/07/2014   No results found for: IRON, TIBC, FERRITIN  Attestation Statements:   Reviewed by clinician on day of visit: allergies, medications, problem list, medical history, surgical history, family history, social history, and previous encounter notes.  I, Lizbeth Bark, RMA, am acting as Location manager for [employer], DO.  I have reviewed the above documentation for accuracy and completeness, and I agree with the above. Jearld Lesch, DO

## 2021-03-02 DIAGNOSIS — M25571 Pain in right ankle and joints of right foot: Secondary | ICD-10-CM | POA: Diagnosis not present

## 2021-03-02 DIAGNOSIS — M7661 Achilles tendinitis, right leg: Secondary | ICD-10-CM | POA: Diagnosis not present

## 2021-03-07 ENCOUNTER — Encounter (INDEPENDENT_AMBULATORY_CARE_PROVIDER_SITE_OTHER): Payer: Self-pay | Admitting: Bariatrics

## 2021-03-08 DIAGNOSIS — M159 Polyosteoarthritis, unspecified: Secondary | ICD-10-CM | POA: Diagnosis not present

## 2021-03-08 DIAGNOSIS — Z6834 Body mass index (BMI) 34.0-34.9, adult: Secondary | ICD-10-CM | POA: Diagnosis not present

## 2021-03-08 DIAGNOSIS — M25571 Pain in right ankle and joints of right foot: Secondary | ICD-10-CM | POA: Diagnosis not present

## 2021-03-08 DIAGNOSIS — M25511 Pain in right shoulder: Secondary | ICD-10-CM | POA: Diagnosis not present

## 2021-03-08 DIAGNOSIS — M7661 Achilles tendinitis, right leg: Secondary | ICD-10-CM | POA: Diagnosis not present

## 2021-03-10 DIAGNOSIS — M7661 Achilles tendinitis, right leg: Secondary | ICD-10-CM | POA: Diagnosis not present

## 2021-03-10 DIAGNOSIS — M25571 Pain in right ankle and joints of right foot: Secondary | ICD-10-CM | POA: Diagnosis not present

## 2021-03-16 ENCOUNTER — Telehealth (INDEPENDENT_AMBULATORY_CARE_PROVIDER_SITE_OTHER): Payer: Self-pay

## 2021-03-16 DIAGNOSIS — M25571 Pain in right ankle and joints of right foot: Secondary | ICD-10-CM | POA: Diagnosis not present

## 2021-03-16 DIAGNOSIS — M7661 Achilles tendinitis, right leg: Secondary | ICD-10-CM | POA: Diagnosis not present

## 2021-03-16 NOTE — Telephone Encounter (Signed)
PA initiated via [email or web address] for Ozempic 4mg /88mL, injecting 1mg  under the skin once weekly.  Madeline Warren (Key: [id number]) Rx #: [id number] Ozempic (1 MG/DOSE) 4MG [name] pen-injectors   Form: [employer] Electronic PA Form [id number] NCPDP) Determination: Pending   [employer] is reviewing your PA request and will respond within 24 hours for Medicaid or up to 72 hours for non-Medicaid plans, based on the required timeframe determined by state or federal regulations. To check for an update later, open this request from your dashboard.

## 2021-03-20 ENCOUNTER — Encounter (INDEPENDENT_AMBULATORY_CARE_PROVIDER_SITE_OTHER): Payer: Self-pay | Admitting: Bariatrics

## 2021-03-22 ENCOUNTER — Telehealth (INDEPENDENT_AMBULATORY_CARE_PROVIDER_SITE_OTHER): Payer: Self-pay | Admitting: Bariatrics

## 2021-03-22 NOTE — Telephone Encounter (Signed)
[employer] called regarding denial for Ozempic. Says prior authorization need to be submitted with additional information.

## 2021-03-23 DIAGNOSIS — M7661 Achilles tendinitis, right leg: Secondary | ICD-10-CM | POA: Diagnosis not present

## 2021-03-28 ENCOUNTER — Other Ambulatory Visit: Payer: Self-pay

## 2021-03-28 ENCOUNTER — Encounter (INDEPENDENT_AMBULATORY_CARE_PROVIDER_SITE_OTHER): Payer: Self-pay | Admitting: Bariatrics

## 2021-03-28 ENCOUNTER — Ambulatory Visit (INDEPENDENT_AMBULATORY_CARE_PROVIDER_SITE_OTHER): Payer: BC Managed Care – PPO | Admitting: Bariatrics

## 2021-03-28 VITALS — BP 134/86 | HR 71 | Temp 97.9°F | Ht 65.0 in | Wt 202.0 lb

## 2021-03-28 DIAGNOSIS — E559 Vitamin D deficiency, unspecified: Secondary | ICD-10-CM | POA: Diagnosis not present

## 2021-03-28 DIAGNOSIS — Z6833 Body mass index (BMI) 33.0-33.9, adult: Secondary | ICD-10-CM

## 2021-03-28 DIAGNOSIS — R632 Polyphagia: Secondary | ICD-10-CM | POA: Diagnosis not present

## 2021-03-28 DIAGNOSIS — E8881 Metabolic syndrome: Secondary | ICD-10-CM | POA: Diagnosis not present

## 2021-03-28 DIAGNOSIS — E6609 Other obesity due to excess calories: Secondary | ICD-10-CM

## 2021-03-28 DIAGNOSIS — Z6839 Body mass index (BMI) 39.0-39.9, adult: Secondary | ICD-10-CM

## 2021-03-28 MED ORDER — ONDANSETRON HCL 4 MG PO TABS: 4.0000 mg | ORAL_TABLET | Freq: Three times a day (TID) | ORAL | 0 refills | Status: DC | PRN

## 2021-03-28 MED ORDER — VITAMIN D (ERGOCALCIFEROL) 1.25 MG (50000 UNIT) PO CAPS: 50000.0000 [IU] | ORAL_CAPSULE | ORAL | 0 refills | Status: DC

## 2021-03-28 MED ORDER — WEGOVY 0.5 MG/0.5ML ~~LOC~~ SOAJ: 0.5000 mg | Freq: Every day | SUBCUTANEOUS | 0 refills | Status: DC

## 2021-03-28 NOTE — Progress Notes (Signed)
Chief Complaint:   OBESITY Madeline Warren is here to discuss her progress with her obesity treatment plan along with follow-up of her obesity related diagnoses. Madeline Warren is on keeping a food journal and adhering to recommended goals of 2,000 calories and 80 grams of protein and states she is following her eating plan approximately 70% of the time. Madeline Warren states she is doing psychical therapy for 60 minutes 2 times per week.  Today's visit was #: 23 Starting weight: 237 lbs Starting date: 12/30/2019 Today's weight: 202 lbs Today's date: 03/28/2021 Total lbs lost to date: 35 lbs Total lbs lost since last in-office visit: 1 lb  Interim History: Madeline Warren is down 1 lbs since her last visit.  Subjective:   1. Insulin resistance Madeline Warren has been taking Ozempic, but probiotics with insulin.  2. Polyphagia Madeline Warren is currently taking Ozempic.   3. Vitamin D deficiency Madeline Warren is taking Vitamin D currently.  Assessment/Plan:   1. Insulin resistance We will check her secondary insurance [employer] for Ozempic. Madeline Warren agrees to begin [place] with no refills.  Madeline Warren will continue to work on weight loss, exercise, and decreasing simple carbohydrates to help decrease the risk of diabetes. Madeline Warren agreed to follow-up with [place] as directed to closely monitor her progress.  - Semaglutide-Weight Management (WEGOVY) 0.5 MG/0.5ML SOAJ; Inject 0.5 mg into the skin daily.  Dispense: 2 mL; Refill: 0  2. Polyphagia Intensive lifestyle modifications are the first line treatment for this issue. Madeline Warren agrees to begin [place] with no refills. We will refill Zofran 4 mg with no refills. We discussed several lifestyle modifications today and she will continue to work on diet, exercise and weight loss efforts. Orders and follow up as documented in patient record.  Counseling Polyphagia is excessive hunger. Causes can include: low blood sugars, hypERthyroidism, PMS, lack of sleep, stress, insulin resistance,  diabetes, certain medications, and diets that are deficient in protein and fiber.    - ondansetron (ZOFRAN) 4 MG tablet; Take 1 tablet (4 mg total) by mouth every 8 (eight) hours as needed for nausea or vomiting.  Dispense: 20 tablet; Refill: 0 - Semaglutide-Weight Management (WEGOVY) 0.5 MG/0.5ML SOAJ; Inject 0.5 mg into the skin daily.  Dispense: 2 mL; Refill: 0  3. Vitamin D deficiency Low Vitamin D level contributes to fatigue and are associated with obesity, breast, and colon cancer. We will refill prescription Vitamin D 50,000 IU every week for 1 month with no refills and Madeline Warren will follow-up for routine testing of Vitamin D, at least 2-3 times per year to avoid over-replacement.  - Vitamin D, Ergocalciferol, (DRISDOL) 1.25 MG (50000 UNIT) CAPS capsule; Take 1 capsule (50,000 Units total) by mouth every 7 (seven) days.  Dispense: 4 capsule; Refill: 0  4. Obesity, BMI today 33.3 Madeline Warren is currently in the action stage of change. As such, her goal is to continue with weight loss efforts. She has agreed to keeping a food journal and adhering to recommended goals of 2,000 calories and 80 grams of protein.   Madeline Warren will continue intentional eating and she will continue meal planning.  Exercise goals:  As is.  Behavioral modification strategies: increasing lean protein intake, decreasing simple carbohydrates, increasing vegetables, increasing water intake, decreasing eating out, no skipping meals, meal planning and cooking strategies, keeping healthy foods in the home, and planning for success.  Madeline Warren has agreed to follow-up with our clinic in 4 weeks. She was informed of the importance of frequent follow-up visits to maximize her success with intensive  lifestyle modifications for her multiple health conditions.   Objective:   Blood pressure 134/86, pulse 71, temperature 97.9 F (36.6 C), height 5\' 5"  (1.651 m), weight 202 lb (91.6 kg), last menstrual period 12/22/2013, SpO2 98 %. Body  mass index is 33.61 kg/m.  General: Cooperative, alert, well developed, in no acute distress. HEENT: Conjunctivae and lids unremarkable. Cardiovascular: Regular rhythm.  Lungs: Normal work of breathing. Neurologic: No focal deficits.   Lab Results  Component Value Date   CREATININE 0.71 12/07/2020   BUN 9 12/07/2020   NA 142 12/07/2020   K 4.5 12/07/2020   CL 106 12/07/2020   CO2 23 12/07/2020   Lab Results  Component Value Date   ALT 16 12/07/2020   AST 16 12/07/2020   ALKPHOS 57 12/07/2020   BILITOT 0.5 12/07/2020   Lab Results  Component Value Date   HGBA1C 5.2 06/21/2020   HGBA1C 5.6 12/30/2019   Lab Results  Component Value Date   INSULIN 14.8 12/07/2020   INSULIN 13.1 06/21/2020   INSULIN 17.3 12/30/2019   Lab Results  Component Value Date   TSH 3.190 06/21/2020   Lab Results  Component Value Date   CHOL 223 (H) 12/07/2020   HDL 51 12/07/2020   LDLCALC 157 (H) 12/07/2020   TRIG 87 12/07/2020   Lab Results  Component Value Date   VD25OH 50.4 12/07/2020   VD25OH 32.5 06/21/2020   VD25OH 33.2 12/30/2019   Lab Results  Component Value Date   WBC 10.6 (H) 01/07/2014   HGB 10.5 (L) 01/07/2014   HCT 33.5 (L) 01/07/2014   MCV 80.3 01/07/2014   PLT 287 01/07/2014   No results found for: IRON, TIBC, FERRITIN  Attestation Statements:   Reviewed by clinician on day of visit: allergies, medications, problem list, medical history, surgical history, family history, social history, and previous encounter notes.  I, Lizbeth Bark, RMA, am acting as Location manager for [employer], DO.  I have reviewed the above documentation for accuracy and completeness, and I agree with the above. Jearld Lesch, DO

## 2021-03-29 ENCOUNTER — Encounter (INDEPENDENT_AMBULATORY_CARE_PROVIDER_SITE_OTHER): Payer: Self-pay | Admitting: Bariatrics

## 2021-04-02 ENCOUNTER — Observation Stay (HOSPITAL_COMMUNITY)
Admission: EM | Admit: 2021-04-02 | Discharge: 2021-04-04 | Disposition: A | Discharge status: Home / Self Care | Payer: BC Managed Care – PPO | Attending: Internal Medicine | Admitting: Internal Medicine

## 2021-04-02 ENCOUNTER — Encounter (HOSPITAL_COMMUNITY): Payer: Self-pay

## 2021-04-02 DIAGNOSIS — J45909 Unspecified asthma, uncomplicated: Secondary | ICD-10-CM | POA: Insufficient documentation

## 2021-04-02 DIAGNOSIS — K529 Noninfective gastroenteritis and colitis, unspecified: Secondary | ICD-10-CM | POA: Diagnosis not present

## 2021-04-02 DIAGNOSIS — K633 Ulcer of intestine: Principal | ICD-10-CM | POA: Insufficient documentation

## 2021-04-02 DIAGNOSIS — R1032 Left lower quadrant pain: Secondary | ICD-10-CM | POA: Diagnosis not present

## 2021-04-02 DIAGNOSIS — K219 Gastro-esophageal reflux disease without esophagitis: Secondary | ICD-10-CM | POA: Insufficient documentation

## 2021-04-02 DIAGNOSIS — K921 Melena: Secondary | ICD-10-CM

## 2021-04-02 DIAGNOSIS — Z7985 Long-term (current) use of injectable non-insulin antidiabetic drugs: Secondary | ICD-10-CM | POA: Diagnosis not present

## 2021-04-02 DIAGNOSIS — R109 Unspecified abdominal pain: Secondary | ICD-10-CM | POA: Diagnosis not present

## 2021-04-02 LAB — CBC WITH DIFFERENTIAL/PLATELET
Abs Immature Granulocytes: 0.02 10*3/uL (ref 0.00–0.07)
Basophils Absolute: 0 10*3/uL (ref 0.0–0.1)
Basophils Relative: 0 %
Eosinophils Absolute: 0 10*3/uL (ref 0.0–0.5)
Eosinophils Relative: 0 %
HCT: 47.9 % — ABNORMAL HIGH (ref 36.0–46.0)
Hemoglobin: 15.4 g/dL — ABNORMAL HIGH (ref 12.0–15.0)
Immature Granulocytes: 0 %
Lymphocytes Relative: 24 %
Lymphs Abs: 2.3 10*3/uL (ref 0.7–4.0)
MCH: 29.4 pg (ref 26.0–34.0)
MCHC: 32.2 g/dL (ref 30.0–36.0)
MCV: 91.4 fL (ref 80.0–100.0)
Monocytes Absolute: 0.5 10*3/uL (ref 0.1–1.0)
Monocytes Relative: 5 %
Neutro Abs: 6.7 10*3/uL (ref 1.7–7.7)
Neutrophils Relative %: 71 %
Platelets: 217 10*3/uL (ref 150–400)
RBC: 5.24 MIL/uL — ABNORMAL HIGH (ref 3.87–5.11)
RDW: 13.5 % (ref 11.5–15.5)
WBC: 9.6 10*3/uL (ref 4.0–10.5)
nRBC: 0 % (ref 0.0–0.2)

## 2021-04-02 LAB — COMPREHENSIVE METABOLIC PANEL
ALT: 16 U/L (ref 0–44)
AST: 15 U/L (ref 15–41)
Albumin: 3.7 g/dL (ref 3.5–5.0)
Alkaline Phosphatase: 43 U/L (ref 38–126)
Anion gap: 14 (ref 5–15)
BUN: 9 mg/dL (ref 6–20)
CO2: 23 mmol/L (ref 22–32)
Calcium: 9.6 mg/dL (ref 8.9–10.3)
Chloride: 101 mmol/L (ref 98–111)
Creatinine, Ser: 0.81 mg/dL (ref 0.44–1.00)
GFR, Estimated: >60 mL/min (ref >60)
Glucose, Bld: 102 mg/dL — ABNORMAL HIGH (ref 70–99)
Potassium: 4.6 mmol/L (ref 3.5–5.1)
Sodium: 138 mmol/L (ref 135–145)
Total Bilirubin: 0.8 mg/dL (ref 0.3–1.2)
Total Protein: 6.9 g/dL (ref 6.5–8.1)

## 2021-04-02 LAB — PROTIME-INR
INR: 1 (ref 0.8–1.2)
Prothrombin Time: 13.3 seconds (ref 11.4–15.2)

## 2021-04-02 LAB — TYPE AND SCREEN
ABO/RH(D): A POS
Antibody Screen: NEGATIVE

## 2021-04-02 LAB — APTT: aPTT: 28 seconds (ref 24–36)

## 2021-04-02 LAB — LIPASE, BLOOD: Lipase: 28 U/L (ref 11–51)

## 2021-04-02 MED ORDER — OXYCODONE-ACETAMINOPHEN 5-325 MG PO TABS
1.0000 | ORAL_TABLET | Freq: Once | ORAL | Status: AC
  Administered 2021-04-02: 1 via ORAL
  Filled 2021-04-02: qty 1

## 2021-04-02 MED ORDER — ONDANSETRON 4 MG PO TBDP
4.0000 mg | ORAL_TABLET | Freq: Once | ORAL | Status: AC
  Administered 2021-04-02: 4 mg via ORAL

## 2021-04-02 NOTE — ED Triage Notes (Signed)
Pt arrives POV for eval of LLQ abd pain/cramping w/ radiation around to L flank. Pt reports chills/shaking. Pt also reports seeing blood in stool this evening w/ blood in bowl and while wiping. Pt reports nausea

## 2021-04-02 NOTE — ED Provider Triage Note (Signed)
Emergency Medicine Provider Triage Evaluation Note  Madeline Warren , a 50 y.o. female  was evaluated in triage.  Pt complains of left lower quadrant abdominal pain onset this morning.  Left lower quadrant abdominal pain radiates to her left flank.  She has a history of similar symptoms when she was evaluated at [facility] over the summer.  At that time she had a complete work-up.  She has associated chills, blood in the stool, nausea.  Has not tried medication for her symptoms.  Denies excessive NSAID use or alcohol use.  Denies prior history of GI bleed.  Denies fever, dysuria, hematuria..   Review of Systems  Positive: As per HPI above. Negative: Fever, dysuria, hematuria  Physical Exam  BP (!) 137/92 (BP Location: Right Arm)    Pulse (!) 104    Temp 98.6 F (37 C) (Oral)    Resp 17    Ht 5\' 5"  (1.651 m)    Wt 92 kg    LMP 12/22/2013    SpO2 100%    BMI 33.75 kg/m  Gen:   Awake, no distress   Resp:  Normal effort  MSK:   Moves extremities without difficulty  Other:  Left lower quadrant tenderness to palpation.  No CVA tenderness noted bilaterally.  Medical Decision Making  Medically screening exam initiated at 10:30 PM.  Appropriate orders placed.  Madeline Warren was informed that the remainder of the evaluation will be completed by another provider, this initial triage assessment does not replace that evaluation, and the importance of remaining in the ED until their evaluation is complete.    Kolin Erdahl A, PA-C 04/02/21 [id number]

## 2021-04-03 ENCOUNTER — Encounter (HOSPITAL_COMMUNITY): Payer: Self-pay | Admitting: Physician Assistant

## 2021-04-03 ENCOUNTER — Emergency Department (HOSPITAL_COMMUNITY): Payer: BC Managed Care – PPO

## 2021-04-03 DIAGNOSIS — R1032 Left lower quadrant pain: Secondary | ICD-10-CM | POA: Diagnosis not present

## 2021-04-03 DIAGNOSIS — K921 Melena: Secondary | ICD-10-CM | POA: Diagnosis present

## 2021-04-03 DIAGNOSIS — K633 Ulcer of intestine: Secondary | ICD-10-CM | POA: Diagnosis not present

## 2021-04-03 DIAGNOSIS — R933 Abnormal findings on diagnostic imaging of other parts of digestive tract: Secondary | ICD-10-CM | POA: Diagnosis not present

## 2021-04-03 DIAGNOSIS — R109 Unspecified abdominal pain: Secondary | ICD-10-CM | POA: Diagnosis not present

## 2021-04-03 LAB — URINALYSIS, ROUTINE W REFLEX MICROSCOPIC
Bilirubin Urine: NEGATIVE
Glucose, UA: NEGATIVE mg/dL
Ketones, ur: 5 mg/dL — AB
Nitrite: NEGATIVE
Protein, ur: 30 mg/dL — AB
Specific Gravity, Urine: 1.018 (ref 1.005–1.030)
pH: 6 (ref 5.0–8.0)

## 2021-04-03 LAB — ABO/RH: ABO/RH(D): A POS

## 2021-04-03 MED ORDER — METOCLOPRAMIDE HCL 5 MG/ML IJ SOLN
10.0000 mg | Freq: Four times a day (QID) | INTRAMUSCULAR | Status: AC
  Administered 2021-04-03 (×2): 10 mg via INTRAVENOUS
  Filled 2021-04-03 (×2): qty 2

## 2021-04-03 MED ORDER — ALBUTEROL SULFATE (2.5 MG/3ML) 0.083% IN NEBU: 2.5000 mg | INHALATION_SOLUTION | Freq: Four times a day (QID) | RESPIRATORY_TRACT | Status: DC | PRN

## 2021-04-03 MED ORDER — ACETAMINOPHEN 650 MG RE SUPP: 650.0000 mg | Freq: Four times a day (QID) | RECTAL | Status: DC | PRN

## 2021-04-03 MED ORDER — MORPHINE SULFATE (PF) 4 MG/ML IV SOLN
4.0000 mg | Freq: Once | INTRAVENOUS | Status: AC
  Administered 2021-04-03: 4 mg via INTRAVENOUS
  Filled 2021-04-03: qty 1

## 2021-04-03 MED ORDER — IOHEXOL 300 MG/ML  SOLN
100.0000 mL | Freq: Once | INTRAMUSCULAR | Status: AC | PRN
  Administered 2021-04-03: 100 mL via INTRAVENOUS

## 2021-04-03 MED ORDER — PEG-KCL-NACL-NASULF-NA ASC-C 100 G PO SOLR
0.5000 | Freq: Once | ORAL | Status: AC
  Administered 2021-04-03: 100 g via ORAL
  Filled 2021-04-03: qty 1

## 2021-04-03 MED ORDER — PEG-KCL-NACL-NASULF-NA ASC-C 100 G PO SOLR: 1.0000 | Freq: Once | ORAL | Status: DC

## 2021-04-03 MED ORDER — BISACODYL 5 MG PO TBEC
20.0000 mg | DELAYED_RELEASE_TABLET | Freq: Once | ORAL | Status: AC
  Administered 2021-04-03: 20 mg via ORAL
  Filled 2021-04-03: qty 4

## 2021-04-03 MED ORDER — LACTATED RINGERS IV BOLUS
1000.0000 mL | Freq: Once | INTRAVENOUS | Status: AC
  Administered 2021-04-03: 1000 mL via INTRAVENOUS

## 2021-04-03 MED ORDER — ACETAMINOPHEN 325 MG PO TABS
650.0000 mg | ORAL_TABLET | Freq: Four times a day (QID) | ORAL | Status: DC | PRN
  Administered 2021-04-03: 650 mg via ORAL
  Filled 2021-04-03: qty 2

## 2021-04-03 MED ORDER — PEG-KCL-NACL-NASULF-NA ASC-C 100 G PO SOLR
0.5000 | Freq: Once | ORAL | Status: AC
  Administered 2021-04-03: 100 g via ORAL

## 2021-04-03 NOTE — Discharge Instructions (Addendum)
YOU HAD AN ENDOSCOPIC PROCEDURE TODAY: Refer to the procedure report and other information in the discharge instructions given to you for any specific questions about what was found during the examination. If this information does not answer your questions, please call [facility] office at [phone] to clarify.   YOU SHOULD EXPECT: Some feelings of bloating in the abdomen. Passage of more gas than usual. Walking can help get rid of the air that was put into your GI tract during the procedure and reduce the bloating. If you had a lower endoscopy (such as a colonoscopy or flexible sigmoidoscopy) you may notice spotting of blood in your stool or on the toilet paper. Some abdominal soreness may be present for a day or two, also.  DIET: Your first meal following the procedure should be a light meal and then it is ok to progress to your normal diet. A half-sandwich or bowl of soup is an example of a good first meal. Heavy or fried foods are harder to digest and may make you feel nauseous or bloated. Drink plenty of fluids but you should avoid alcoholic beverages for 24 hours. If you had a esophageal dilation, please see attached instructions for diet.    ACTIVITY: Your care partner should take you home directly after the procedure. You should plan to take it easy, moving slowly for the rest of the day. You can resume normal activity the day after the procedure however YOU SHOULD NOT DRIVE, use power tools, machinery or perform tasks that involve climbing or major physical exertion for 24 hours (because of the sedation medicines used during the test).   SYMPTOMS TO REPORT IMMEDIATELY: A gastroenterologist can be reached at any hour. Please call [phone]  for any of the following symptoms:  . Following lower endoscopy (colonoscopy, flexible sigmoidoscopy) Excessive amounts of blood in the stool  Significant tenderness, worsening of abdominal pains  Swelling of the abdomen that is new, acute  Fever of 100  or higher  . Following upper endoscopy (EGD, EUS, ERCP, esophageal dilation) Vomiting of blood or coffee ground material  New, significant abdominal pain  New, significant chest pain or pain under the shoulder blades  Painful or persistently difficult swallowing  New shortness of breath  Black, tarry-looking or red, bloody stools  FOLLOW UP:  If any biopsies were taken you will be contacted by phone or by letter within the next 1-3 weeks. Call [phone]  if you have not heard about the biopsies in 3 weeks.  Please also call with any specific questions about appointments or follow up tests. YOU HAD AN ENDOSCOPIC PROCEDURE TODAY: Refer to the procedure report and other information in the discharge instructions given to you for any specific questions about what was found during the examination. If this information does not answer your questions, please call [facility] office at [phone] to clarify.   YOU SHOULD EXPECT: Some feelings of bloating in the abdomen. Passage of more gas than usual. Walking can help get rid of the air that was put into your GI tract during the procedure and reduce the bloating. If you had a lower endoscopy (such as a colonoscopy or flexible sigmoidoscopy) you may notice spotting of blood in your stool or on the toilet paper. Some abdominal soreness may be present for a day or two, also.  DIET: Your first meal following the procedure should be a light meal and then it is ok to progress to your normal diet. A half-sandwich or bowl of soup   example of a good first meal. Heavy or fried foods are harder to digest and may make you feel nauseous or bloated. Drink plenty of fluids but you should avoid alcoholic beverages for 24 hours. If you had a esophageal dilation, please see attached instructions for diet.    ACTIVITY: Your care partner should take you home directly after the procedure. You should plan to take it easy, moving slowly for the rest of the day. You can resume  normal activity the day after the procedure however YOU SHOULD NOT DRIVE, use power tools, machinery or perform tasks that involve climbing or major physical exertion for 24 hours (because of the sedation medicines used during the test).   SYMPTOMS TO REPORT IMMEDIATELY: A gastroenterologist can be reached at any hour. Please call [phone]  for any of the following symptoms:  Following lower endoscopy (colonoscopy, flexible sigmoidoscopy) Excessive amounts of blood in the stool  Significant tenderness, worsening of abdominal pains  Swelling of the abdomen that is new, acute  Fever of 100 or higher  Following upper endoscopy (EGD, EUS, ERCP, esophageal dilation) Vomiting of blood or coffee ground material  New, significant abdominal pain  New, significant chest pain or pain under the shoulder blades  Painful or persistently difficult swallowing  New shortness of breath  Black, tarry-looking or red, bloody stools  FOLLOW UP:  If any biopsies were taken you will be contacted by phone or by letter within the next 1-3 weeks. Call [phone]  if you have not heard about the biopsies in 3 weeks.  Please also call with any specific questions about appointments or follow up tests.     Internal Medicine MD Instructions: You were seen in the hospital for bloody stools.  We did a colonoscopy which showed some areas of your colon that were affected by Ozempic.  Otherwise, you did well in the hospital and did not have any other episodes of bloody stools.  We will discontinue Ozempic today. Please hold any other weight loss medications until further discussion with your primary care doctor. You do not need to see GI for any other follow-up appointments.  Please follow-up with your primary care provider in the next 1-2 weeks to discuss your recent hospitalization.  If any symptoms change or worsen acutely, please return to the nearest emergency department.

## 2021-04-03 NOTE — ED Provider Notes (Signed)
[facility] DEPARTMENT Provider Note   CSN: [id number] Arrival date & time: 04/02/21  2202     History Chief Complaint  Patient presents with   Abdominal Pain    ROXANN VIERRA is a 51 y.o. female presents to the ED for evaluation of LLQ pain with nausea and hematochezia since yesterday. The patient reports that she was eating lunch yesterday and around 2-3 hours later, she felt the need to have a bowel movement. She reports a h/o constipation and reports that is was hard to produce a BM, but was eventually successful. Afterwards she reports she had a voluminous stool followed by an episode of loose stools with frank red blood in the toilet.  Denies any melena.  She reports some nausea, but no vomiting.  Denies any fevers, dysuria, or hematuria.  Denies any chest pain or shortness of breath.  Denies any recent travel or antibiotic use.  She denies any medical history only surgical history was C-section years ago.  Medications include Ozempic, Zofran as needed, and vitamin D.  She is allergic to amoxicillin.  Denies any tobacco, EtOH, illicit drug use ever.  The patient denies ever having a colonoscopy.     Abdominal Pain Associated symptoms: constipation, diarrhea and nausea   Associated symptoms: no chest pain, no chills, no dysuria, no fever, no hematuria, no shortness of breath and no vomiting       Home Medications Prior to Admission medications   Medication Sig Start Date End Date Taking? Authorizing Provider  albuterol (VENTOLIN HFA) 108 (90 Base) MCG/ACT inhaler Inhale 2 puffs into the lungs every 6 (six) hours as needed for wheezing or shortness of breath. 12/01/19   Freddi Starr, MD  Multiple Vitamin (MULTIVITAMIN) tablet Take 1 tablet by mouth daily.    [provider]  ondansetron (ZOFRAN) 4 MG tablet Take 1 tablet (4 mg total) by mouth every 8 (eight) hours as needed for nausea or vomiting. 03/28/21   Georgia Lopes, DO   Semaglutide-Weight Management (WEGOVY) 0.5 MG/0.5ML SOAJ Inject 0.5 mg into the skin daily. 03/28/21   Georgia Lopes, DO  Vitamin D, Ergocalciferol, (DRISDOL) 1.25 MG (50000 UNIT) CAPS capsule Take 1 capsule (50,000 Units total) by mouth every 7 (seven) days. 03/28/21   Jearld Lesch A, DO      Allergies    Avocado and Amoxicillin    Review of Systems   Review of Systems  Constitutional:  Negative for chills and fever.  Respiratory:  Negative for shortness of breath.   Cardiovascular:  Negative for chest pain.  Gastrointestinal:  Positive for abdominal pain, blood in stool, constipation, diarrhea and nausea. Negative for vomiting.  Genitourinary:  Negative for dysuria and hematuria.  All other systems reviewed and are negative.  Physical Exam Updated Vital Signs BP 130/76 (BP Location: Right Arm)    Pulse 64    Temp 98.1 F (36.7 C) (Oral)    Resp 18    Ht 5\' 5"  (1.651 m)    Wt 92 kg    LMP 12/22/2013    SpO2 99%    BMI 33.75 kg/m  Physical Exam Vitals and nursing note reviewed.  Constitutional:      General: She is not in acute distress.    Appearance: Normal appearance. She is not toxic-appearing.  HENT:     Head: Normocephalic and atraumatic.  Eyes:     General: No scleral icterus. Cardiovascular:     Rate and Rhythm: Normal rate  and regular rhythm.  Pulmonary:     Effort: Pulmonary effort is normal.     Breath sounds: Normal breath sounds.  Abdominal:     General: Abdomen is flat. Bowel sounds are normal.     Palpations: Abdomen is soft.     Tenderness: There is abdominal tenderness. There is no guarding or rebound.     Comments: Mild tenderness to the left lower quadrant.  No overlying skin changes noted.  Normal active bowel sounds.  No rebound or guarding.  Negative Rovsing's.  Musculoskeletal:        General: No deformity.     Cervical back: Normal range of motion.  Skin:    General: Skin is warm and dry.  Neurological:     General: No focal deficit present.      Mental Status: She is alert. Mental status is at baseline.    ED Results / Procedures / Treatments   Labs (all labs ordered are listed, but only abnormal results are displayed) Labs Reviewed  CBC WITH DIFFERENTIAL/PLATELET - Abnormal; Notable for the following components:      Result Value   RBC 5.24 (*)    Hemoglobin 15.4 (*)    HCT 47.9 (*)    All other components within normal limits  COMPREHENSIVE METABOLIC PANEL - Abnormal; Notable for the following components:   Glucose, Bld 102 (*)    All other components within normal limits  URINALYSIS, ROUTINE W REFLEX MICROSCOPIC - Abnormal; Notable for the following components:   Color, Urine AMBER (*)    APPearance HAZY (*)    Hgb urine dipstick MODERATE (*)    Ketones, ur 5 (*)    Protein, ur 30 (*)    Leukocytes,Ua TRACE (*)    Bacteria, UA RARE (*)    All other components within normal limits  LIPASE, BLOOD  PROTIME-INR  APTT  TYPE AND SCREEN  ABO/RH    EKG None  Radiology CT ABDOMEN PELVIS W CONTRAST  Result Date: 04/03/2021 CLINICAL DATA:  Left lower quadrant abdominal pain. EXAM: CT ABDOMEN AND PELVIS WITH CONTRAST TECHNIQUE: Multidetector CT imaging of the abdomen and pelvis was performed using the standard protocol following bolus administration of intravenous contrast. RADIATION DOSE REDUCTION: This exam was performed according to the departmental dose-optimization program which includes automated exposure control, adjustment of the mA and/or kV according to patient size and/or use of iterative reconstruction technique. CONTRAST:  13mL OMNIPAQUE IOHEXOL 300 MG/ML  SOLN COMPARISON:  None. FINDINGS: Lower chest: No acute abnormality. Hepatobiliary: No focal liver abnormality is seen. No gallstones, gallbladder wall thickening, or biliary dilatation. Pancreas: Unremarkable. No pancreatic ductal dilatation or surrounding inflammatory changes. Spleen: Normal in size without focal abnormality. Adrenals/Urinary Tract: Adrenal  glands are unremarkable. Kidneys are normal, without renal calculi, focal lesion, or hydronephrosis. Bladder is unremarkable. Stomach/Bowel: The stomach is within normal limits. There is colonic wall thickening and fat stranding involving the distal transverse and descending colon. No bowel obstruction, free air or pneumatosis. A normal appendix is seen in the right lower quadrant. Vascular/Lymphatic: No significant vascular findings are present. No enlarged abdominal or pelvic lymph nodes. Reproductive: The uterus is unremarkable. A cyst is present in the left ovary measuring 2.2 cm, likely dominant follicle. Other: No abdominal wall hernia or abnormality. No abdominopelvic ascites. Musculoskeletal: No acute osseous abnormality. IMPRESSION: Findings compatible with colitis involving the distal transverse colon and descending colon. Electronically Signed   By: Brett Fairy M.D.   On: 04/03/2021 00:23  Procedures Procedures   Medications Ordered in ED Medications  morphine 4 MG/ML injection 4 mg (has no administration in time range)  lactated ringers bolus 1,000 mL (has no administration in time range)  oxyCODONE-acetaminophen (PERCOCET/ROXICET) 5-325 MG per tablet 1 tablet (1 tablet Oral Given 04/02/21 2245)  ondansetron (ZOFRAN-ODT) disintegrating tablet 4 mg (4 mg Oral Given 04/02/21 2246)  iohexol (OMNIPAQUE) 300 MG/ML solution 100 mL (100 mLs Intravenous Contrast Given 04/03/21 0014)    ED Course/ Medical Decision Making/ A&P                           Medical Decision Making Risk Prescription drug management.   51 year old female presents emergency department for evaluation of left lower quadrant pain with hematochezia.  Differential diagnosis includes but is not limited to diverticulitis, colitis, infectious diarrhea, C. difficile.  Vital signs are stable.  Patient is afebrile and normotensive.  Physical exam is pertinent for some very mild left lower quadrant tenderness palpation.   Abdomen soft without guarding or rebound.  Negative Rovsing's.  Labs and imaging ordered in triage.  I independently reviewed and interpreted the patient's labs and imaging.  CMP shows mildly elevated glucose at 102 however there is no electrolyte abnormalities.  Normal LFTs.  CBC shows elevated H&H suspicion for hemoconcentration due to dehydration.  No leukocytosis.  aPTT and PT/INR within normal limits.  Urinalysis shows amber along with moderate blood, 5 ketones, 30 protein trace leukocytes, but rare bacteria and 0-5 white blood cells.  Blood is likely from patient's rectum.  Lipase normal.  I agree with the radiologist finding.  CT shows findings compatible with colitis involving the distal transverse colon and descending colon with surrounding inflammatory changes.  Patient was given applesauce without emesis.  Given the frank blood and colitis seen on CT, paged GI.  Spoke to Azucena Freed who evaluated the patient and wanted Dr. Modena Nunnery to discuss to the patient.  Dr. Tarri Glenn spoke with patient who would like admission to the hospital for a colonoscopy.  Will admit to medicine team.  Spoke to Dr. Alfonse Spruce who will admit.  Final Clinical Impression(s) / ED Diagnoses Final diagnoses:  Colitis    Rx / DC Orders ED Discharge Orders     None         Sherrell Puller, PA-C 04/03/21 1640    Sherwood Gambler, MD 04/04/21 2145

## 2021-04-03 NOTE — Consult Note (Signed)
[facility] Consult: 10:51 AM 04/03/2021  LOS: 0 days    Referring Provider: Dr Regenia Skeeter in ED  Primary Care Physician:  Nicoletta Dress, MD Primary Gastroenterologist:  unassigned.       Reason for Consultation:  bloody stool, hematochezia and L abd pain.    HPI: Madeline Warren is a 51 y.o. female.  PMH Normocytic anemia with Hb 10.5 in 12/2013.  Endometrial polyp. Patient previously has not consented to undergo colonoscopy and did not follow-up with plans for Cologuard as discussed with her PCP.  Since starting on Ozempic for weight loss in May.  She has had exacerbation of reflux symptoms. Had generalized abdominal pain and went to [facility] ED in August.  CT scan there showed thickened jejunal folds, query under distention vs mild enteritis.  Providers there prescribed Protonix daily which she finished after 30 days and did not continue.  She continues to take prescribed probiotics intermittently. She is also developed constipation since starting Ozempic.  Dulcolax helps, MiraLAX and stool softeners were not of much benefit.  She is try to increase her water intake and even used suppositories a couple of times, not recently. For 3 to 4 weeks patient's noticed irritation in the xiphoid region when drinking coffee or orange juice which normally did not cause problems.  Experiences some queasiness as well.  Seems to tolerate low acid orange juice.  This past week she has had 1 bout of constipation. And hard stools.  After lunch yesterday patient developed nausea and pain in her left abdomen along with tremors/shakes getting/cold chills.  In the evening she had loose stool and saw some blood after wiping.  A little before 9 PM she had a large stool with significant amount of blood but stool itself formed, brown.   She has had no further bleeding.  No dizziness.  No vomiting.  Infrequent use of ibuprofen or Aleve but nothing recently.  No new medications other than the Ozempic last spring.  Overall patient has lost about 40 pounds since 12/2019, 17 of these lost since starting Ozempic in 07/2020 Presented to ED for evaluation.  BP 130s/80s to 90s.  Heart rate initially 104 but subsided into the 60s currently  CTAP w contrast: colitis of distal transverse and descending colon  WBCs, platelets, INR CMET normal.  Hgb 15.4 (12.9 in August 2022).   Family history positive for asthma, diabetes, obesity, stroke, elevated cholesterol.  Dad w signif hx PUD w bleeding.   Receptionist at [employer].  Does not smoke.  Does not consume alcoholic beverages.  Lives with her husband in [place].    Past Medical History:  Diagnosis Date   Diffusion capacity of lung (dl), decreased    Endometrial polyp    Food allergy    avocado   Lower back pain    PONV (postoperative nausea and vomiting)    after c-section   Vitamin D deficiency     Past Surgical History:  Procedure Laterality Date   CESAREAN SECTION  1997   DILATATION & CURRETTAGE/HYSTEROSCOPY WITH RESECTOCOPE N/A  01/07/2014   Procedure: [place];  Surgeon: Darlyn Chamber, MD;  Location: [facility];  Service: Gynecology;  Laterality: N/A;   POLYPECTOMY  2015   Benign endometrial polyp resected 2015    Prior to Admission medications   Medication Sig Start Date End Date Taking? Authorizing Provider  albuterol (VENTOLIN HFA) 108 (90 Base) MCG/ACT inhaler Inhale 2 puffs into the lungs every 6 (six) hours as needed for wheezing or shortness of breath. 12/01/19   Freddi Starr, MD  Multiple Vitamin (MULTIVITAMIN) tablet Take 1 tablet by mouth daily.    [provider]  ondansetron (ZOFRAN) 4 MG tablet Take 1 tablet (4 mg total) by mouth every 8 (eight) hours as needed for nausea or  vomiting. 03/28/21   Georgia Lopes, DO  Semaglutide-Weight Management (WEGOVY) 0.5 MG/0.5ML SOAJ Inject 0.5 mg into the skin daily. 03/28/21   Georgia Lopes, DO  Vitamin D, Ergocalciferol, (DRISDOL) 1.25 MG (50000 UNIT) CAPS capsule Take 1 capsule (50,000 Units total) by mouth every 7 (seven) days. 03/28/21   Jearld Lesch A, DO    Scheduled Meds:  Infusions:  PRN Meds:    Allergies as of 04/02/2021 - Review Complete 04/02/2021  Allergen Reaction Noted   Avocado Nausea And Vomiting 01/05/2014   Amoxicillin Rash 01/05/2014    Family History  Problem Relation Age of Onset   Asthma Mother    Diabetes Mother    Sudden death Mother    Obesity Mother    High Cholesterol Father    Stroke Father     Social History   Socioeconomic History   Marital status: Married    Spouse name: Elta Guadeloupe   Number of children: Not on file   Years of education: Not on file   Highest education level: Not on file  Occupational History   Occupation: Receptionist  Tobacco Use   Smoking status: Never   Smokeless tobacco: Never  Substance and Sexual Activity   Alcohol use: Yes    Comment: rare   Drug use: No   Sexual activity: Not on file  Other Topics Concern   Not on file  Social History Narrative   Not on file   Social Determinants of Health   Financial Resource Strain: Not on file  Food Insecurity: Not on file  Transportation Needs: Not on file  Physical Activity: Not on file  Stress: Not on file  Social Connections: Not on file  Intimate Partner Violence: Not on file    REVIEW OF SYSTEMS: Constitutional: No weakness, no fatigue ENT:  No nose bleeds Pulm: No difficulty breathing or cough. CV:  No palpitations, no LE edema.  No angina GU:  No hematuria, no frequency GI:  see HPI.   Heme: No unusual bleeding or bruising other than GI as above. Transfusions: None Neuro:  No headaches, no peripheral tingling or numbness.  No dizziness, no seizures. Derm:  No itching, no rash or  sores.  Endocrine:  No sweats or chills.  No polyuria or dysuria Immunization:  Not queried   PHYSICAL EXAM: Vital signs in last 24 hours: Vitals:   04/03/21 0806 04/03/21 1012  BP: 130/76 111/69  Pulse: 64 62  Resp: 18 17  Temp: 98.1 F (36.7 C) 98.2 F (36.8 C)  SpO2: 99% 100%   Wt Readings from Last 3 Encounters:  04/02/21 92 kg  03/28/21 91.6 kg  03/01/21 92.1 kg    General: Patient looks well.  No distress.  Sitting up comfortable on the stretcher. Head: No facial asymmetry or swelling.  No signs of head trauma. Eyes: No conjunctival pallor. Ears: No hearing deficit Nose: No congestion or discharge Mouth: Oral mucosa is moist, pink, clear.  Good dentition.  Tongue midline. Neck: No JVD, no masses, no thyromegaly Lungs: Clear bilaterally.  No labored breathing or cough. Heart: RRR.  No MRG.  S1, S2 present. Abdomen: Had received a dose of morphine earlier.  Soft, slight left abdominal tenderness.  No guarding or rebound.  Bowel sounds active. Rectal: Deferred as patient was in the hallway on a stretcher. Musc/Skeltl: No joint redness, swelling or gross deformity. Extremities: No CCE. Neurologic: Oriented x3.  Excellent historian.  Moves all 4 limbs without weakness or tremor. Skin: No rash, no sores, no suspicious lesions, no telangiectasia.  No tattoos observed Nodes: No cervical adenopathy Psych: Calm, pleasant, cooperative.  Intake/Output from previous day: No intake/output data recorded. Intake/Output this shift: No intake/output data recorded.  LAB RESULTS: Recent Labs    04/02/21 2245  WBC 9.6  HGB 15.4*  HCT 47.9*  PLT 217   BMET Lab Results  Component Value Date   NA 138 04/02/2021   NA 142 12/07/2020   NA 142 06/21/2020   K 4.6 04/02/2021   K 4.5 12/07/2020   K 4.8 06/21/2020   CL 101 04/02/2021   CL 106 12/07/2020   CL 104 06/21/2020   CO2 23 04/02/2021   CO2 23 12/07/2020   CO2 22 06/21/2020   GLUCOSE 102 (H) 04/02/2021   GLUCOSE  83 12/07/2020   GLUCOSE 95 06/21/2020   BUN 9 04/02/2021   BUN 9 12/07/2020   BUN 12 06/21/2020   CREATININE 0.81 04/02/2021   CREATININE 0.71 12/07/2020   CREATININE 0.77 06/21/2020   CALCIUM 9.6 04/02/2021   CALCIUM 9.2 12/07/2020   CALCIUM 9.5 06/21/2020   LFT Recent Labs    04/02/21 2245  PROT 6.9  ALBUMIN 3.7  AST 15  ALT 16  ALKPHOS 43  BILITOT 0.8   PT/INR Lab Results  Component Value Date   INR 1.0 04/02/2021   Hepatitis Panel No results for input(s): HEPBSAG, HCVAB, HEPAIGM, HEPBIGM in the last 72 hours. C-Diff No components found for: CDIFF Lipase     Component Value Date/Time   LIPASE 28 04/02/2021 2245    Drugs of Abuse  No results found for: LABOPIA, COCAINSCRNUR, LABBENZ, AMPHETMU, THCU, LABBARB   RADIOLOGY STUDIES: CT ABDOMEN PELVIS W CONTRAST  Result Date: 04/03/2021 CLINICAL DATA:  Left lower quadrant abdominal pain. EXAM: CT ABDOMEN AND PELVIS WITH CONTRAST TECHNIQUE: Multidetector CT imaging of the abdomen and pelvis was performed using the standard protocol following bolus administration of intravenous contrast. RADIATION DOSE REDUCTION: This exam was performed according to the departmental dose-optimization program which includes automated exposure control, adjustment of the mA and/or kV according to patient size and/or use of iterative reconstruction technique. CONTRAST:  142mL OMNIPAQUE IOHEXOL 300 MG/ML  SOLN COMPARISON:  None. FINDINGS: Lower chest: No acute abnormality. Hepatobiliary: No focal liver abnormality is seen. No gallstones, gallbladder wall thickening, or biliary dilatation. Pancreas: Unremarkable. No pancreatic ductal dilatation or surrounding inflammatory changes. Spleen: Normal in size without focal abnormality. Adrenals/Urinary Tract: Adrenal glands are unremarkable. Kidneys are normal, without renal calculi, focal lesion, or hydronephrosis. Bladder is unremarkable. Stomach/Bowel: The stomach is within normal limits. There is  colonic wall thickening and fat stranding involving the distal transverse and descending colon. No bowel obstruction, free air or pneumatosis. A normal  appendix is seen in the right lower quadrant. Vascular/Lymphatic: No significant vascular findings are present. No enlarged abdominal or pelvic lymph nodes. Reproductive: The uterus is unremarkable. A cyst is present in the left ovary measuring 2.2 cm, likely dominant follicle. Other: No abdominal wall hernia or abnormality. No abdominopelvic ascites. Musculoskeletal: No acute osseous abnormality. IMPRESSION: Findings compatible with colitis involving the distal transverse colon and descending colon. Electronically Signed   By: Brett Fairy M.D.   On: 04/03/2021 00:23      IMPRESSION:     Acute left sided colitis w bloody stool and abd pain.  Suspect ischemic colitis.  CT scan in August 2022 raised soft possibility of jejunal inflammation so there is a possibility of Crohn's disease though this is not a high suspicion.  No previous colonoscopy.  Hemodynamically stable with excellent Hgb though suspect an element of hemoconcentration.     GERD.   More prominent symptoms since starting Ozempic last spring.  Not using PPI since finishing month-long prescription this summer.    PLAN:     Obs admission, colonoscopy tmrw.  See prep orders, clears.     Azucena Freed  04/03/2021, 10:51 AM Phone [phone]

## 2021-04-03 NOTE — H&P (Signed)
Date: 04/03/2021               Patient Name:  Madeline Warren MRN: [id number]  DOB: 1970-06-24 Age / Sex: 51 y.o., female   PCP: Nicoletta Dress, MD         Medical Service: Internal Medicine Teaching Service         Attending Physician: Dr. Charise Killian, MD    First Contact: Dr. Lorin Glass Pager: [phone]  Second Contact: Dr. Alfonse Spruce Pager: [phone]       After Hours (After 5p/  First Contact Pager: [phone]  weekends / holidays): Second Contact Pager: [phone]   Chief Complaint: bloody stools and left-sided abdominal pain  History of Present Illness: Madeline Warren is a 51 y.o. female with a PMHx of obesity, vitamin D deficiency, and who is s/p C-section in 1997 who presents to [facility] with a chief complaint of bloody stools and left lower quadrant abdominal pain.  The patient states that she started taking Ozempic in May.  Ever since then, she has lost about 30 pounds but has also felt constipated and nauseous on this medication.  Last August, she was seen at [facility] for epigastric pain, and CT scan at that time showed an "irritated small intestine."   Most recently, the patient started having left lower quadrant abdominal pain after 1 PM.  She had the feeling as if she was going to have a bowel movement but could not at first.  Then, around 10 PM, she had very loose and bloody bowel movement.  Also endorsed feeling shaky with chills.  Ever since yesterday, her LLQ pain has been intermittent and crampy.  It lasts about 1-2 minutes at a time.  Denies vomiting, recent travel, recent changes in her diet, sick contacts, or blood clots.  She has never had a colonoscopy in the past.  She does take over-the-counter pain meds for headaches as needed but endorses taking them infrequently.  She ate applesauce while in the ED, but she otherwise has not had much to eat since 1 PM yesterday.  There are no other complaints or concerns at this time.  Meds:  No outpatient medications have been marked  as taking for the 04/02/21 encounter [facility] Encounter).  Ozempic   Allergies: Allergies as of 04/02/2021 - Review Complete 04/02/2021  Allergen Reaction Noted   Avocado Nausea And Vomiting 01/05/2014   Amoxicillin Rash 01/05/2014   Past Medical History:  Diagnosis Date   Diffusion capacity of lung (dl), decreased    Endometrial polyp    Food allergy    avocado   Lower back pain    PONV (postoperative nausea and vomiting)    after c-section   Vitamin D deficiency     Family History: Mother with diabetes.  No family history of colon cancer.  No other notable family history.  Social History: Denies tobacco, alcohol, or illicit drug use.  States that she has been on a vegan diet for the past few years.  Review of Systems: A complete ROS was negative except as per HPI.   Physical Exam: Blood pressure 100/65, pulse (!) 59, temperature 98.1 F (36.7 C), temperature source Oral, resp. rate 16, height 5\' 5"  (1.651 m), weight 92 kg, last menstrual period 12/22/2013, SpO2 98 %. General: NAD, nl appearance HE: Normocephalic, atraumatic, EOMI, Conjunctivae normal ENT: No congestion, no rhinorrhea, no exudate or erythema, moist mucous membranes Cardiovascular: Normal rate, regular rhythm. No murmurs, rubs, or gallops Pulmonary: Effort normal,  breath sounds normal. No wheezes, rales, or rhonchi Abdominal: soft, nontender, bowel sounds present Musculoskeletal: no swelling, deformity, injury or tenderness in extremities Skin: Warm, dry, no bruising, erythema, or rash Psychiatric/Behavioral: normal mood, normal behavior     CT Abdomen/Pelvis with Contrast: IMPRESSION: Findings compatible with colitis involving the distal transverse colon and descending colon.   Assessment & Plan by Problem: Principal Problem:   Hematochezia  Acute left-sided colitis Patient presents with hematochezia and LLQ abdominal pain.  On arrival, she is afebrile and hemodynamically stable.  Physical exam is  unremarkable.  Labs are unremarkable with the exception of UA showing moderate blood, trace leukocytes, rare bacteria; this is more likely from the patient's rectum. Hgb 15.4. CT shows findings consistent with colitis involving the distal transverse colon and descending colon. GI has been consulted and are planning on a colonoscopy tomorrow. Received fluid bolus, IV morphine, and anti-emetics in the ED. Differential includes ischemic colitis in the setting of Ozympic use. Other differentials include IBD, less likely an infection -GI is on board, appreciate recommendations -Plan for colonoscopy tomorrow -Trend CBC, BMP -Reglan 10 mg IV q6h  Polycythemia Hgb of 15.4 today. Suspicious for hemoconcentration 2/2 dehydration, but will continue to monitor. -s/p LR bolus -Trend CBC  Insulin resistance Last A1c of 5.2 nine months ago. Pt has been on Ozympic since last May and has lost ~30 pounds, but she endorses nausea and constipation ever since starting this regimen. -A1c pending -Holding Ozympic  Asthma Takes albuterol PRN at home. Denies SOB. Satting well on RA. - Continue home albuterol  Dispo: Admit patient to Observation with expected length of stay less than 2 midnights.  Signed: Orvis Brill, MD 04/03/2021, 5:23 PM  Pager: [phone]  After 5pm on weekdays and 1pm on weekends: On Call pager: [phone]

## 2021-04-03 NOTE — H&P (View-Only) (Signed)
[facility] Consult: 10:51 AM 04/03/2021  LOS: 0 days    Referring Provider: Dr Regenia Skeeter in ED  Primary Care Physician:  Nicoletta Dress, MD Primary Gastroenterologist:  unassigned.       Reason for Consultation:  bloody stool, hematochezia and L abd pain.    HPI: Madeline Warren is a 51 y.o. female.  PMH Normocytic anemia with Hb 10.5 in 12/2013.  Endometrial polyp. Patient previously has not consented to undergo colonoscopy and did not follow-up with plans for Cologuard as discussed with her PCP.  Since starting on Ozempic for weight loss in May.  She has had exacerbation of reflux symptoms. Had generalized abdominal pain and went to [facility] ED in August.  CT scan there showed thickened jejunal folds, query under distention vs mild enteritis.  Providers there prescribed Protonix daily which she finished after 30 days and did not continue.  She continues to take prescribed probiotics intermittently. She is also developed constipation since starting Ozempic.  Dulcolax helps, MiraLAX and stool softeners were not of much benefit.  She is try to increase her water intake and even used suppositories a couple of times, not recently. For 3 to 4 weeks patient's noticed irritation in the xiphoid region when drinking coffee or orange juice which normally did not cause problems.  Experiences some queasiness as well.  Seems to tolerate low acid orange juice.  This past week she has had 1 bout of constipation. And hard stools.  After lunch yesterday patient developed nausea and pain in her left abdomen along with tremors/shakes getting/cold chills.  In the evening she had loose stool and saw some blood after wiping.  A little before 9 PM she had a large stool with significant amount of blood but stool itself formed, brown.   She has had no further bleeding.  No dizziness.  No vomiting.  Infrequent use of ibuprofen or Aleve but nothing recently.  No new medications other than the Ozempic last spring.  Overall patient has lost about 40 pounds since 12/2019, 17 of these lost since starting Ozempic in 07/2020 Presented to ED for evaluation.  BP 130s/80s to 90s.  Heart rate initially 104 but subsided into the 60s currently  CTAP w contrast: colitis of distal transverse and descending colon  WBCs, platelets, INR CMET normal.  Hgb 15.4 (12.9 in August 2022).   Family history positive for asthma, diabetes, obesity, stroke, elevated cholesterol.  Dad w signif hx PUD w bleeding.   Receptionist at [employer].  Does not smoke.  Does not consume alcoholic beverages.  Lives with her husband in [place].    Past Medical History:  Diagnosis Date   Diffusion capacity of lung (dl), decreased    Endometrial polyp    Food allergy    avocado   Lower back pain    PONV (postoperative nausea and vomiting)    after c-section   Vitamin D deficiency     Past Surgical History:  Procedure Laterality Date   CESAREAN SECTION  1997   DILATATION & CURRETTAGE/HYSTEROSCOPY WITH RESECTOCOPE N/A  01/07/2014   Procedure: Sharon;  Surgeon: Darlyn Chamber, MD;  Location: [facility];  Service: Gynecology;  Laterality: N/A;   POLYPECTOMY  2015   Benign endometrial polyp resected 2015    Prior to Admission medications   Medication Sig Start Date End Date Taking? Authorizing Provider  albuterol (VENTOLIN HFA) 108 (90 Base) MCG/ACT inhaler Inhale 2 puffs into the lungs every 6 (six) hours as needed for wheezing or shortness of breath. 12/01/19   Freddi Starr, MD  Multiple Vitamin (MULTIVITAMIN) tablet Take 1 tablet by mouth daily.    [provider]  ondansetron (ZOFRAN) 4 MG tablet Take 1 tablet (4 mg total) by mouth every 8 (eight) hours as needed for nausea or  vomiting. 03/28/21   Georgia Lopes, DO  Semaglutide-Weight Management (WEGOVY) 0.5 MG/0.5ML SOAJ Inject 0.5 mg into the skin daily. 03/28/21   Georgia Lopes, DO  Vitamin D, Ergocalciferol, (DRISDOL) 1.25 MG (50000 UNIT) CAPS capsule Take 1 capsule (50,000 Units total) by mouth every 7 (seven) days. 03/28/21   Jearld Lesch A, DO    Scheduled Meds:  Infusions:  PRN Meds:    Allergies as of 04/02/2021 - Review Complete 04/02/2021  Allergen Reaction Noted   Avocado Nausea And Vomiting 01/05/2014   Amoxicillin Rash 01/05/2014    Family History  Problem Relation Age of Onset   Asthma Mother    Diabetes Mother    Sudden death Mother    Obesity Mother    High Cholesterol Father    Stroke Father     Social History   Socioeconomic History   Marital status: Married    Spouse name: Elta Guadeloupe   Number of children: Not on file   Years of education: Not on file   Highest education level: Not on file  Occupational History   Occupation: Receptionist  Tobacco Use   Smoking status: Never   Smokeless tobacco: Never  Substance and Sexual Activity   Alcohol use: Yes    Comment: rare   Drug use: No   Sexual activity: Not on file  Other Topics Concern   Not on file  Social History Narrative   Not on file   Social Determinants of Health   Financial Resource Strain: Not on file  Food Insecurity: Not on file  Transportation Needs: Not on file  Physical Activity: Not on file  Stress: Not on file  Social Connections: Not on file  Intimate Partner Violence: Not on file    REVIEW OF SYSTEMS: Constitutional: No weakness, no fatigue ENT:  No nose bleeds Pulm: No difficulty breathing or cough. CV:  No palpitations, no LE edema.  No angina GU:  No hematuria, no frequency GI:  see HPI.   Heme: No unusual bleeding or bruising other than GI as above. Transfusions: None Neuro:  No headaches, no peripheral tingling or numbness.  No dizziness, no seizures. Derm:  No itching, no rash or  sores.  Endocrine:  No sweats or chills.  No polyuria or dysuria Immunization:  Not queried   PHYSICAL EXAM: Vital signs in last 24 hours: Vitals:   04/03/21 0806 04/03/21 1012  BP: 130/76 111/69  Pulse: 64 62  Resp: 18 17  Temp: 98.1 F (36.7 C) 98.2 F (36.8 C)  SpO2: 99% 100%   Wt Readings from Last 3 Encounters:  04/02/21 92 kg  03/28/21 91.6 kg  03/01/21 92.1 kg    General: Patient looks well.  No distress.  Sitting up comfortable on the stretcher. Head: No facial asymmetry or swelling.  No signs of head trauma. Eyes: No conjunctival pallor. Ears: No hearing deficit Nose: No congestion or discharge Mouth: Oral mucosa is moist, pink, clear.  Good dentition.  Tongue midline. Neck: No JVD, no masses, no thyromegaly Lungs: Clear bilaterally.  No labored breathing or cough. Heart: RRR.  No MRG.  S1, S2 present. Abdomen: Had received a dose of morphine earlier.  Soft, slight left abdominal tenderness.  No guarding or rebound.  Bowel sounds active. Rectal: Deferred as patient was in the hallway on a stretcher. Musc/Skeltl: No joint redness, swelling or gross deformity. Extremities: No CCE. Neurologic: Oriented x3.  Excellent historian.  Moves all 4 limbs without weakness or tremor. Skin: No rash, no sores, no suspicious lesions, no telangiectasia.  No tattoos observed Nodes: No cervical adenopathy Psych: Calm, pleasant, cooperative.  Intake/Output from previous day: No intake/output data recorded. Intake/Output this shift: No intake/output data recorded.  LAB RESULTS: Recent Labs    04/02/21 2245  WBC 9.6  HGB 15.4*  HCT 47.9*  PLT 217   BMET Lab Results  Component Value Date   NA 138 04/02/2021   NA 142 12/07/2020   NA 142 06/21/2020   K 4.6 04/02/2021   K 4.5 12/07/2020   K 4.8 06/21/2020   CL 101 04/02/2021   CL 106 12/07/2020   CL 104 06/21/2020   CO2 23 04/02/2021   CO2 23 12/07/2020   CO2 22 06/21/2020   GLUCOSE 102 (H) 04/02/2021   GLUCOSE  83 12/07/2020   GLUCOSE 95 06/21/2020   BUN 9 04/02/2021   BUN 9 12/07/2020   BUN 12 06/21/2020   CREATININE 0.81 04/02/2021   CREATININE 0.71 12/07/2020   CREATININE 0.77 06/21/2020   CALCIUM 9.6 04/02/2021   CALCIUM 9.2 12/07/2020   CALCIUM 9.5 06/21/2020   LFT Recent Labs    04/02/21 2245  PROT 6.9  ALBUMIN 3.7  AST 15  ALT 16  ALKPHOS 43  BILITOT 0.8   PT/INR Lab Results  Component Value Date   INR 1.0 04/02/2021   Hepatitis Panel No results for input(s): HEPBSAG, HCVAB, HEPAIGM, HEPBIGM in the last 72 hours. C-Diff No components found for: CDIFF Lipase     Component Value Date/Time   LIPASE 28 04/02/2021 2245    Drugs of Abuse  No results found for: LABOPIA, COCAINSCRNUR, LABBENZ, AMPHETMU, THCU, LABBARB   RADIOLOGY STUDIES: CT ABDOMEN PELVIS W CONTRAST  Result Date: 04/03/2021 CLINICAL DATA:  Left lower quadrant abdominal pain. EXAM: CT ABDOMEN AND PELVIS WITH CONTRAST TECHNIQUE: Multidetector CT imaging of the abdomen and pelvis was performed using the standard protocol following bolus administration of intravenous contrast. RADIATION DOSE REDUCTION: This exam was performed according to the departmental dose-optimization program which includes automated exposure control, adjustment of the mA and/or kV according to patient size and/or use of iterative reconstruction technique. CONTRAST:  155mL OMNIPAQUE IOHEXOL 300 MG/ML  SOLN COMPARISON:  None. FINDINGS: Lower chest: No acute abnormality. Hepatobiliary: No focal liver abnormality is seen. No gallstones, gallbladder wall thickening, or biliary dilatation. Pancreas: Unremarkable. No pancreatic ductal dilatation or surrounding inflammatory changes. Spleen: Normal in size without focal abnormality. Adrenals/Urinary Tract: Adrenal glands are unremarkable. Kidneys are normal, without renal calculi, focal lesion, or hydronephrosis. Bladder is unremarkable. Stomach/Bowel: The stomach is within normal limits. There is  colonic wall thickening and fat stranding involving the distal transverse and descending colon. No bowel obstruction, free air or pneumatosis. A normal  appendix is seen in the right lower quadrant. Vascular/Lymphatic: No significant vascular findings are present. No enlarged abdominal or pelvic lymph nodes. Reproductive: The uterus is unremarkable. A cyst is present in the left ovary measuring 2.2 cm, likely dominant follicle. Other: No abdominal wall hernia or abnormality. No abdominopelvic ascites. Musculoskeletal: No acute osseous abnormality. IMPRESSION: Findings compatible with colitis involving the distal transverse colon and descending colon. Electronically Signed   By: Brett Fairy M.D.   On: 04/03/2021 00:23      IMPRESSION:     Acute left sided colitis w bloody stool and abd pain.  Suspect ischemic colitis.  CT scan in August 2022 raised soft possibility of jejunal inflammation so there is a possibility of Crohn's disease though this is not a high suspicion.  No previous colonoscopy.  Hemodynamically stable with excellent Hgb though suspect an element of hemoconcentration.     GERD.   More prominent symptoms since starting Ozempic last spring.  Not using PPI since finishing month-long prescription this summer.    PLAN:     Obs admission, colonoscopy tmrw.  See prep orders, clears.     Azucena Freed  04/03/2021, 10:51 AM Phone [phone]

## 2021-04-04 ENCOUNTER — Observation Stay (HOSPITAL_COMMUNITY): Payer: BC Managed Care – PPO | Admitting: Anesthesiology

## 2021-04-04 ENCOUNTER — Encounter (HOSPITAL_COMMUNITY): Payer: Self-pay | Admitting: Internal Medicine

## 2021-04-04 ENCOUNTER — Encounter (HOSPITAL_COMMUNITY)
Admission: EM | Disposition: A | Discharge status: Home / Self Care | Payer: Self-pay | Source: Home / Self Care | Attending: Emergency Medicine

## 2021-04-04 ENCOUNTER — Other Ambulatory Visit: Payer: Self-pay

## 2021-04-04 DIAGNOSIS — K529 Noninfective gastroenteritis and colitis, unspecified: Secondary | ICD-10-CM

## 2021-04-04 DIAGNOSIS — K633 Ulcer of intestine: Secondary | ICD-10-CM | POA: Diagnosis not present

## 2021-04-04 DIAGNOSIS — K559 Vascular disorder of intestine, unspecified: Secondary | ICD-10-CM | POA: Diagnosis not present

## 2021-04-04 DIAGNOSIS — K625 Hemorrhage of anus and rectum: Secondary | ICD-10-CM | POA: Diagnosis not present

## 2021-04-04 DIAGNOSIS — K921 Melena: Secondary | ICD-10-CM | POA: Diagnosis not present

## 2021-04-04 HISTORY — PX: BIOPSY: SHX5522

## 2021-04-04 HISTORY — PX: COLONOSCOPY WITH PROPOFOL: SHX5780

## 2021-04-04 LAB — LIPID PANEL
Cholesterol: 186 mg/dL (ref 0–200)
HDL: 45 mg/dL (ref >40)
LDL Cholesterol: 134 mg/dL — ABNORMAL HIGH (ref 0–99)
Total CHOL/HDL Ratio: 4.1 RATIO
Triglycerides: 33 mg/dL (ref <150)
VLDL: 7 mg/dL (ref 0–40)

## 2021-04-04 LAB — CBC
HCT: 43.5 % (ref 36.0–46.0)
Hemoglobin: 14 g/dL (ref 12.0–15.0)
MCH: 29.8 pg (ref 26.0–34.0)
MCHC: 32.2 g/dL (ref 30.0–36.0)
MCV: 92.6 fL (ref 80.0–100.0)
Platelets: 189 10*3/uL (ref 150–400)
RBC: 4.7 MIL/uL (ref 3.87–5.11)
RDW: 13.7 % (ref 11.5–15.5)
WBC: 8.7 10*3/uL (ref 4.0–10.5)
nRBC: 0 % (ref 0.0–0.2)

## 2021-04-04 LAB — BASIC METABOLIC PANEL
Anion gap: 8 (ref 5–15)
BUN: 9 mg/dL (ref 6–20)
CO2: 26 mmol/L (ref 22–32)
Calcium: 8.7 mg/dL — ABNORMAL LOW (ref 8.9–10.3)
Chloride: 110 mmol/L (ref 98–111)
Creatinine, Ser: 0.82 mg/dL (ref 0.44–1.00)
GFR, Estimated: >60 mL/min (ref >60)
Glucose, Bld: 89 mg/dL (ref 70–99)
Potassium: 3.9 mmol/L (ref 3.5–5.1)
Sodium: 144 mmol/L (ref 135–145)

## 2021-04-04 LAB — HEMOGLOBIN A1C
Hgb A1c MFr Bld: 5.1 % (ref 4.8–5.6)
Mean Plasma Glucose: 100 mg/dL

## 2021-04-04 LAB — HIV ANTIBODY (ROUTINE TESTING W REFLEX): HIV Screen 4th Generation wRfx: NONREACTIVE

## 2021-04-04 LAB — CBG MONITORING, ED: Glucose-Capillary: 89 mg/dL (ref 70–99)

## 2021-04-04 SURGERY — COLONOSCOPY WITH PROPOFOL
Anesthesia: Monitor Anesthesia Care

## 2021-04-04 MED ORDER — SODIUM CHLORIDE 0.9 % IV SOLN: INTRAVENOUS | Status: DC

## 2021-04-04 MED ORDER — LACTATED RINGERS IV SOLN: INTRAVENOUS | Status: DC | PRN

## 2021-04-04 MED ORDER — METRONIDAZOLE 500 MG PO TABS
500.0000 mg | ORAL_TABLET | Freq: Three times a day (TID) | ORAL | 0 refills | Status: AC
Start: 2021-04-04 — End: 2021-04-11

## 2021-04-04 MED ORDER — LIDOCAINE 2% (20 MG/ML) 5 ML SYRINGE
INTRAMUSCULAR | Status: DC | PRN
  Administered 2021-04-04: 100 mg via INTRAVENOUS

## 2021-04-04 MED ORDER — PROPOFOL 10 MG/ML IV BOLUS
INTRAVENOUS | Status: DC | PRN
Start: 2021-04-04 — End: 2021-04-04
  Administered 2021-04-04: 50 mg via INTRAVENOUS

## 2021-04-04 MED ORDER — PROPOFOL 500 MG/50ML IV EMUL
INTRAVENOUS | Status: DC | PRN
  Administered 2021-04-04: 150 ug/kg/min via INTRAVENOUS

## 2021-04-04 MED ORDER — CIPROFLOXACIN HCL 500 MG PO TABS
500.0000 mg | ORAL_TABLET | Freq: Two times a day (BID) | ORAL | 0 refills | Status: AC
Start: 2021-04-04 — End: 2021-04-11

## 2021-04-04 MED ORDER — LACTATED RINGERS IV SOLN: INTRAVENOUS | Status: DC

## 2021-04-04 SURGICAL SUPPLY — 22 items

## 2021-04-04 NOTE — Progress Notes (Signed)
° °  Subjective: Patient was seen at bedside during rounds today.  She denies any bloody bowel movements since 2 days ago.  She is not having any abdominal pain currently. There are no other complaints or concerns at this time.  Objective:  Vital signs in last 24 hours: Vitals:   04/03/21 1614 04/03/21 2048 04/04/21 0031 04/04/21 0545  BP: 100/65 (!) 159/102 105/60 (!) 98/56  Pulse: (!) 59 82 62 (!) 54  Resp: 16 16 15 15   Temp: 98.1 F (36.7 C) 98.6 F (37 C) 98.3 F (36.8 C)   TempSrc: Oral Oral Oral   SpO2: 98% 100% 97% 100%  Weight:      Height:       General: NAD, nl appearance HE: Normocephalic, atraumatic, EOMI, Conjunctivae normal ENT: No congestion, no rhinorrhea, no exudate or erythema  Cardiovascular: Normal rate, regular rhythm. No murmurs, rubs, or gallops Pulmonary: Effort normal, breath sounds normal. No wheezes, rales, or rhonchi Abdominal: soft, nontender, bowel sounds present Musculoskeletal: no swelling, deformity, injury or tenderness in extremities Skin: Warm, dry, no bruising, erythema, or rash Psychiatric/Behavioral: normal mood, normal behavior    Assessment/Plan:  Principal Problem:   Hematochezia  Acute left-sided colitis, with hematochezia and abdominal pain Patient denies recurrent episodes of hematochezia since prior to admission.  Her blood pressures are somewhat soft today, most recently 98/56, likely in the setting of decreased p.o. intake the past couple days.  Otherwise, she is hemodynamically stable at this time.  Hemoglobin of 14 today.  Likely ischemic colitis in the setting of Ozempic use.  GI is planning on a colonoscopy today.  Otherwise, continue fluid resuscitation and antiemetics as needed. -GI is on board, appreciate recommendations -Plan for colonoscopy today -Trend CBC, BMP -Reglan 10 mg IV q6h  Insulin resistance HLD Last A1c of 5.2 nine months ago. Pt has been on Ozympic since last May and has lost ~30 pounds, but she endorses  nausea and constipation ever since starting this regimen.  Lipid panel resulted in LDL of 134 today. -A1c pending -Holding Ozempic  Polycythemia, resolved Hgb of 15.4 yesterday, now 14. Likely hemoconcentration 2/2 dehydration. -Trend CBC   Asthma Takes albuterol PRN at home. Denies SOB. Satting well on RA. - Continue home albuterol  Prior to Admission Living Arrangement: Home Anticipated Discharge Location: Home Barriers to Discharge: Colonoscopy, medical stability Dispo: Anticipated discharge in approximately 0-1 day(s).   Orvis Brill, MD 04/04/2021, 7:24 AM Pager: [phone]  After 5pm on weekdays and 1pm on weekends: On Call pager [phone]

## 2021-04-04 NOTE — Transfer of Care (Signed)
Immediate Anesthesia Transfer of Care Note  Patient: Madeline Warren  Procedure(s) Performed: COLONOSCOPY WITH PROPOFOL BIOPSY  Patient Location: PACU  Anesthesia Type:MAC  Level of Consciousness: drowsy and patient cooperative  Airway & Oxygen Therapy: Patient Spontanous Breathing and Patient connected to nasal cannula oxygen  Post-op Assessment: Report given to RN  Post vital signs: Reviewed and stable  Last Vitals:  Vitals Value Taken Time  BP    Temp    Pulse 73 04/04/21 1208  Resp 21 04/04/21 1208  SpO2 100 % 04/04/21 1208  Vitals shown include unvalidated device data.  Last Pain:  Vitals:   04/04/21 1016  TempSrc: Oral  PainSc: 0-No pain         Complications: No notable events documented.

## 2021-04-04 NOTE — Op Note (Signed)
[facility] Patient Name: Madeline Warren Procedure Date : 04/04/2021 MRN: [id number] Attending MD: Thornton Park MD, MD Date of Birth: [birth year] CSN: [id number] Age: 51 Admit Type: Inpatient Procedure:                Colonoscopy Indications:              Abdominal pain, Rectal bleeding, abnormal CT scan Providers:                Thornton Park MD, MD, Grace Isaac, RN, Tyna Jaksch Technician Referring MD:              Medicines:                Monitored Anesthesia Care Complications:            No immediate complications. Estimated blood loss:                            Minimal. Estimated Blood Loss:     Estimated blood loss was minimal. Procedure:                Pre-Anesthesia Assessment:                           - Prior to the procedure, a History and Physical                            was performed, and patient medications and                            allergies were reviewed. The patient's tolerance of                            previous anesthesia was also reviewed. The risks                            and benefits of the procedure and the sedation                            options and risks were discussed with the patient.                            All questions were answered, and informed consent                            was obtained. Prior Anticoagulants: The patient has                            taken no previous anticoagulant or antiplatelet                            agents. ASA Grade Assessment: II - A patient with  mild systemic disease. After reviewing the risks                            and benefits, the patient was deemed in                            satisfactory condition to undergo the procedure.                           After obtaining informed consent, the colonoscope                            was passed under direct vision. Throughout the                            procedure, the  patient's blood pressure, pulse, and                            oxygen saturations were monitored continuously. The                            CF-HQ190L ([id number] Olympus coloscope was                            introduced through the anus and advanced to the 3                            cm into the ileum. A second forward view of the                            right colon was performed. The colonoscopy was                            performed without difficulty. The patient tolerated                            the procedure well. The quality of the bowel                            preparation was good. The terminal ileum, ileocecal                            valve, appendiceal orifice, and rectum were                            photographed. Scope In: 11:45:14 AM Scope Out: 40:08:67 AM Scope Withdrawal Time: 0 hours 9 minutes 30 seconds  Total Procedure Duration: 0 hours 13 minutes 4 seconds  Findings:      The perianal and digital rectal examinations were normal.      A continuous area of nonbleeding, linear ulcerated mucosa with stigmata       of recent bleeding was present in the proximal sigmoid colon, in the       descending colon, at the splenic flexure and in the  transverse colon       from 35 to 60 cm from the anal verge. Biopsies were taken with a cold       forceps for histology. Estimated blood loss was minimal.      The exam was otherwise without abnormality on direct and retroflexion       views. Impression:               - Suspected mild-moderate ischemic colitis.                            Biopsied.                           - No colon polyp or mass.                           - The examination was otherwise normal on direct                            and retroflexion views. Recommendation:           - Patient has a contact number available for                            emergencies. The signs and symptoms of potential                            delayed complications  were discussed with the                            patient. Return to normal activities tomorrow.                            Written discharge instructions were provided to the                            patient.                           - Advance diet as tolerated.                           - Continue present medications.                           - Discharge home to complete 7 days of Cipro and                            Flagyl.                           - Await pathology results.                           - Avoid NSAIDs.                           - Stop Ozempic - as  there are reports of associated                            ischemic colitis                           - Plan CTA with recurrent symptoms.                           - Colonoscopy in 10 years for colon cancer                            screening, earlier with new symptoms. Procedure Code(s):        --- Professional ---                           [phone], Colonoscopy, flexible; with biopsy, single                            or multiple Diagnosis Code(s):        --- Professional ---                           K63.3, Ulcer of intestine                           R10.9, Unspecified abdominal pain                           K62.5, Hemorrhage of anus and rectum CPT copyright 2019 [employer]. All rights reserved. The codes documented in this report are preliminary and upon coder review may  be revised to meet current compliance requirements. Thornton Park MD, MD 04/04/2021 12:20:22 PM This report has been signed electronically. Number of Addenda: 0

## 2021-04-04 NOTE — Discharge Summary (Addendum)
Name: Madeline Warren MRN: [id number] DOB: 05/30/1970 51 y.o. PCP: Nicoletta Dress, MD  Date of Admission: 04/02/2021 10:17 PM Date of Discharge: 04/04/2021 Attending Physician: Charise Killian, MD  Discharge Diagnosis: 1. Acute left-sided colitis, with hematochezia and abdominal pain, secondary to ischemic colitis 2. Insulin resistance, HLD 3.  Polycythemia, resolved 4.  Asthma  Discharge Medications: Allergies as of 04/04/2021       Reactions   Avocado Nausea And Vomiting   Face becomes flushed also   Amoxicillin Rash        Medication List     STOP taking these medications    Wegovy 0.5 MG/0.5ML Soaj Generic drug: Semaglutide-Weight Management       TAKE these medications    albuterol 108 (90 Base) MCG/ACT inhaler Commonly known as: VENTOLIN HFA Inhale 2 puffs into the lungs every 6 (six) hours as needed for wheezing or shortness of breath.   multivitamin tablet Take 1 tablet by mouth daily.   ondansetron 4 MG tablet Commonly known as: Zofran Take 1 tablet (4 mg total) by mouth every 8 (eight) hours as needed for nausea or vomiting.   Vitamin D (Ergocalciferol) 1.25 MG (50000 UNIT) Caps capsule Commonly known as: DRISDOL Take 1 capsule (50,000 Units total) by mouth every 7 (seven) days.        Disposition and follow-up:   Ms.Henchy R Gutridge was discharged from [facility] in Good condition.  At the hospital follow up visit please address:  1.  Acute left-sided colitis: Colonoscopy during this admission showed mild to moderate ischemic colitis.  Discontinued Ozempic.  Patient does not require antibiotic treatment nor an outpatient GI follow-up.    2.  Labs / imaging needed at time of follow-up: CBC  3.  Pending labs/ test needing follow-up: A1c  Follow-up Appointments:   Hospital Course by problem list: 1. Acute left-sided colitis with hematochezia and abdominal pain: The patient presented with acute onset hematochezia and LLQ pain.   Also endorsing tenesmus and chills.  No prior colonoscopy.  As needed NSAID use, although not frequent.  No alcohol use.  Patient noted in evaluation in August 2022 for epigastric pain in which a CT scan that noted "irritated small intestine ".  She states that this all started when she started Ozempic for weight loss last May.  She has lost about 20-30 pounds, however she endorses constipation and nausea that occur intermittently since she started this medication.  On admit, BPs were soft, but she was otherwise hemodynamically stable. No tachycardia.  Labs notable for hemoglobin of 15.4, normal platelets, no leukocytosis.  Lipase within normal limits.  CMP unremarkable.  UA with moderate hemoglobin, small ketones, small protein, trace leukocytes.  Hyaline cast present.  CTA abdomen pelvis obtained showing findings compatible with colitis in the distal transverse and descending colon.  GI was consulted and considering ischemic colitis from Ozempic use.  Following day, 1/24, the patient underwent colonoscopy.  Hemoglobin was stable at 14 that morning.  Colonoscopy findings were consistent with mild to moderate ischemic colitis.  Patient was discharged in stable condition with plan to advance diet as tolerated.  Did not require antibiotic tx at DC per our team discussion with GI team. Given that this was likely due to Ozempic, no formal GI follow-up was needed. Of note, patient had some softer blood pressures prior to colonoscopy.  These improved to the 287O systolic after fluid resuscitation. Biopsy pathology pending at time of discharge.  2.  Insulin  resistance/HLD: Last A1c of 5.2 nine months ago. Pt has been on Ozympic since last May and has lost ~30 pounds, but she endorses nausea and constipation ever since starting this regimen.  Lipid panel resulted in LDL of 134.  ASCVD risk of ~1%.  A1c was pending at discharge.  Otherwise, we discontinued Ozempic indefinitely.  3.  Polycythemia, resolved: Hgb of 15.4  yesterday, now 14. Likely hemoconcentration 2/2 dehydration.  4.  Asthma: Patient's home albuterol was continued as needed for history of asthma.  Otherwise, patient denies shortness of breath and was satting well on room air during her hospitalization.  Discharge Exam:   BP (!) 122/59    Pulse 70    Temp (!) 97.3 F (36.3 C) (Temporal)    Resp 14    Ht 5\' 5"  (1.651 m)    Wt 92 kg    LMP 12/22/2013    SpO2 99%    BMI 33.75 kg/m  Discharge exam: General: NAD, nl appearance HE: Normocephalic, atraumatic, EOMI, Conjunctivae normal ENT: No congestion, no rhinorrhea, no exudate or erythema  Cardiovascular: Normal rate, regular rhythm. No murmurs, rubs, or gallops Pulmonary: Effort normal, breath sounds normal. No wheezes, rales, or rhonchi Abdominal: soft, nontender, bowel sounds present Musculoskeletal: no swelling, deformity, injury or tenderness in extremities Skin: Warm, dry, no bruising, erythema, or rash Psychiatric/Behavioral: normal mood, normal behavior    Pertinent Labs, Studies, and Procedures:  CBC Latest Ref Rng & Units 04/04/2021 04/02/2021 01/07/2014  WBC 4.0 - 10.5 K/uL 8.7 9.6 10.6(H)  Hemoglobin 12.0 - 15.0 g/dL 14.0 15.4(H) 10.5(L)  Hematocrit 36.0 - 46.0 % 43.5 47.9(H) 33.5(L)  Platelets 150 - 400 K/uL 189 217 287   BMP Latest Ref Rng & Units 04/04/2021 04/02/2021 12/07/2020  Glucose 70 - 99 mg/dL 89 102(H) 83  BUN 6 - 20 mg/dL 9 9 9   Creatinine 0.44 - 1.00 mg/dL 0.82 0.81 0.71  BUN/Creat Ratio 9 - 23 - - 13  Sodium 135 - 145 mmol/L 144 138 142  Potassium 3.5 - 5.1 mmol/L 3.9 4.6 4.5  Chloride 98 - 111 mmol/L 110 101 106  CO2 22 - 32 mmol/L 26 23 23   Calcium 8.9 - 10.3 mg/dL 8.7(L) 9.6 9.2   CT ABDOMEN PELVIS W CONTRAST  Result Date: 04/03/2021 CLINICAL DATA:  Left lower quadrant abdominal pain. EXAM: CT ABDOMEN AND PELVIS WITH CONTRAST TECHNIQUE: Multidetector CT imaging of the abdomen and pelvis was performed using the standard protocol following bolus  administration of intravenous contrast. RADIATION DOSE REDUCTION: This exam was performed according to the departmental dose-optimization program which includes automated exposure control, adjustment of the mA and/or kV according to patient size and/or use of iterative reconstruction technique. CONTRAST:  173mL OMNIPAQUE IOHEXOL 300 MG/ML  SOLN COMPARISON:  None. FINDINGS: Lower chest: No acute abnormality. Hepatobiliary: No focal liver abnormality is seen. No gallstones, gallbladder wall thickening, or biliary dilatation. Pancreas: Unremarkable. No pancreatic ductal dilatation or surrounding inflammatory changes. Spleen: Normal in size without focal abnormality. Adrenals/Urinary Tract: Adrenal glands are unremarkable. Kidneys are normal, without renal calculi, focal lesion, or hydronephrosis. Bladder is unremarkable. Stomach/Bowel: The stomach is within normal limits. There is colonic wall thickening and fat stranding involving the distal transverse and descending colon. No bowel obstruction, free air or pneumatosis. A normal appendix is seen in the right lower quadrant. Vascular/Lymphatic: No significant vascular findings are present. No enlarged abdominal or pelvic lymph nodes. Reproductive: The uterus is unremarkable. A cyst is present in the left ovary measuring 2.2  cm, likely dominant follicle. Other: No abdominal wall hernia or abnormality. No abdominopelvic ascites. Musculoskeletal: No acute osseous abnormality. IMPRESSION: Findings compatible with colitis involving the distal transverse colon and descending colon. Electronically Signed   By: Brett Fairy M.D.   On: 04/03/2021 00:23     Discharge Instructions: Discharge Instructions     Call MD for:  difficulty breathing, headache or visual disturbances   Complete by: As directed    Call MD for:  persistant dizziness or light-headedness   Complete by: As directed    Call MD for:  persistant nausea and vomiting   Complete by: As directed    Call  MD for:  severe uncontrolled pain   Complete by: As directed    Call MD for:  temperature >100.4   Complete by: As directed        Signed: Orvis Brill, MD 04/04/2021, 1:08 PM   Pager: [phone]

## 2021-04-04 NOTE — H&P (Deleted)
Name: Madeline Warren MRN: [id number] DOB: January 02, 1971 51 y.o. PCP: Nicoletta Dress, MD  Date of Admission: 04/02/2021 10:17 PM Date of Discharge: 04/04/2021 Attending Physician: Charise Killian, MD  Discharge Diagnosis: 1. Acute left-sided colitis, with hematochezia and abdominal pain 2. Insulin resistance, HLD 3.  Polycythemia, resolved 4.  Asthma  Discharge Medications: Allergies as of 04/04/2021       Reactions   Avocado Nausea And Vomiting   Face becomes flushed also   Amoxicillin Rash        Medication List     STOP taking these medications    Wegovy 0.5 MG/0.5ML Soaj Generic drug: Semaglutide-Weight Management       TAKE these medications    albuterol 108 (90 Base) MCG/ACT inhaler Commonly known as: VENTOLIN HFA Inhale 2 puffs into the lungs every 6 (six) hours as needed for wheezing or shortness of breath.   multivitamin tablet Take 1 tablet by mouth daily.   ondansetron 4 MG tablet Commonly known as: Zofran Take 1 tablet (4 mg total) by mouth every 8 (eight) hours as needed for nausea or vomiting.   Vitamin D (Ergocalciferol) 1.25 MG (50000 UNIT) Caps capsule Commonly known as: DRISDOL Take 1 capsule (50,000 Units total) by mouth every 7 (seven) days.        Disposition and follow-up:   Ms.Madeline Warren was discharged from [facility] in Good condition.  At the hospital follow up visit please address:  1.  Acute left-sided colitis: Colonoscopy during this admission showed mild to moderate ischemic colitis.  Discontinued Ozempic.  Patient does not require antibiotic treatment nor an outpatient GI follow-up.    2.  Labs / imaging needed at time of follow-up: CBC  3.  Pending labs/ test needing follow-up: A1c  Follow-up Appointments:   Hospital Course by problem list: 1. Acute left-sided colitis with hematochezia and abdominal pain: The patient presented with acute onset hematochezia and LLQ pain.  Also endorsing tenesmus and  chills.  No prior colonoscopy.  As needed NSAID use, although not frequent.  No alcohol use.  Patient noted in evaluation in August 2022 for epigastric pain in which a CT scan that noted "irritated small intestine ".  She states that this all started when she started Ozempic for weight loss last May.  She has lost about 20-30 pounds, however she endorses constipation and nausea that occur intermittently since she started this medication.  On admit, BPs were soft, but she was otherwise hemodynamically stable. No tachycardia.  Labs notable for hemoglobin of 15.4, normal platelets, no leukocytosis.  Lipase within normal limits.  CMP unremarkable.  UA with moderate hemoglobin, small ketones, small protein, trace leukocytes.  Hyaline cast present.  CTA abdomen pelvis obtained showing findings compatible with colitis in the distal transverse and descending colon.  GI was consulted and considering ischemic colitis from Ozempic use.  Following day, 1/24, the patient underwent colonoscopy.  Hemoglobin was stable at 14 that morning.  Colonoscopy findings were consistent with mild to moderate ischemic colitis.  Patient was discharged in stable condition with plan to advance diet as tolerated.  Did not require antibiotic tx at DC. Given that this was likely due to Ozempic, no formal GI follow-up was needed. Of note, patient had some softer blood pressures prior to colonoscopy.  These improved to the 179X systolic after fluid resuscitation.  2.  Insulin resistance/HLD: Last A1c of 5.2 nine months ago. Pt has been on Ozympic since last May and has  lost ~30 pounds, but she endorses nausea and constipation ever since starting this regimen.  Lipid panel resulted in LDL of 134.  ASCVD risk of ~1%.  A1c was pending at discharge.  Otherwise, we discontinued Ozempic indefinitely.  3.  Polycythemia, resolved: Hgb of 15.4 yesterday, now 14. Likely hemoconcentration 2/2 dehydration.  4.  Asthma: Patient's home albuterol was  continued as needed for history of asthma.  Otherwise, patient denies shortness of breath and was satting well on room air during her hospitalization.  Discharge Exam:   BP (!) 122/59    Pulse 70    Temp (!) 97.3 F (36.3 C) (Temporal)    Resp 14    Ht 5\' 5"  (1.651 m)    Wt 92 kg    LMP 12/22/2013    SpO2 99%    BMI 33.75 kg/m  Discharge exam: General: NAD, nl appearance HE: Normocephalic, atraumatic, EOMI, Conjunctivae normal ENT: No congestion, no rhinorrhea, no exudate or erythema  Cardiovascular: Normal rate, regular rhythm. No murmurs, rubs, or gallops Pulmonary: Effort normal, breath sounds normal. No wheezes, rales, or rhonchi Abdominal: soft, nontender, bowel sounds present Musculoskeletal: no swelling, deformity, injury or tenderness in extremities Skin: Warm, dry, no bruising, erythema, or rash Psychiatric/Behavioral: normal mood, normal behavior    Pertinent Labs, Studies, and Procedures:  CBC Latest Ref Rng & Units 04/04/2021 04/02/2021 01/07/2014  WBC 4.0 - 10.5 K/uL 8.7 9.6 10.6(H)  Hemoglobin 12.0 - 15.0 g/dL 14.0 15.4(H) 10.5(L)  Hematocrit 36.0 - 46.0 % 43.5 47.9(H) 33.5(L)  Platelets 150 - 400 K/uL 189 217 287   BMP Latest Ref Rng & Units 04/04/2021 04/02/2021 12/07/2020  Glucose 70 - 99 mg/dL 89 102(H) 83  BUN 6 - 20 mg/dL 9 9 9   Creatinine 0.44 - 1.00 mg/dL 0.82 0.81 0.71  BUN/Creat Ratio 9 - 23 - - 13  Sodium 135 - 145 mmol/L 144 138 142  Potassium 3.5 - 5.1 mmol/L 3.9 4.6 4.5  Chloride 98 - 111 mmol/L 110 101 106  CO2 22 - 32 mmol/L 26 23 23   Calcium 8.9 - 10.3 mg/dL 8.7(L) 9.6 9.2   CT ABDOMEN PELVIS W CONTRAST  Result Date: 04/03/2021 CLINICAL DATA:  Left lower quadrant abdominal pain. EXAM: CT ABDOMEN AND PELVIS WITH CONTRAST TECHNIQUE: Multidetector CT imaging of the abdomen and pelvis was performed using the standard protocol following bolus administration of intravenous contrast. RADIATION DOSE REDUCTION: This exam was performed according to the  departmental dose-optimization program which includes automated exposure control, adjustment of the mA and/or kV according to patient size and/or use of iterative reconstruction technique. CONTRAST:  168mL OMNIPAQUE IOHEXOL 300 MG/ML  SOLN COMPARISON:  None. FINDINGS: Lower chest: No acute abnormality. Hepatobiliary: No focal liver abnormality is seen. No gallstones, gallbladder wall thickening, or biliary dilatation. Pancreas: Unremarkable. No pancreatic ductal dilatation or surrounding inflammatory changes. Spleen: Normal in size without focal abnormality. Adrenals/Urinary Tract: Adrenal glands are unremarkable. Kidneys are normal, without renal calculi, focal lesion, or hydronephrosis. Bladder is unremarkable. Stomach/Bowel: The stomach is within normal limits. There is colonic wall thickening and fat stranding involving the distal transverse and descending colon. No bowel obstruction, free air or pneumatosis. A normal appendix is seen in the right lower quadrant. Vascular/Lymphatic: No significant vascular findings are present. No enlarged abdominal or pelvic lymph nodes. Reproductive: The uterus is unremarkable. A cyst is present in the left ovary measuring 2.2 cm, likely dominant follicle. Other: No abdominal wall hernia or abnormality. No abdominopelvic ascites. Musculoskeletal: No acute osseous  abnormality. IMPRESSION: Findings compatible with colitis involving the distal transverse colon and descending colon. Electronically Signed   By: Brett Fairy M.D.   On: 04/03/2021 00:23     Discharge Instructions: Discharge Instructions     Call MD for:  difficulty breathing, headache or visual disturbances   Complete by: As directed    Call MD for:  persistant dizziness or light-headedness   Complete by: As directed    Call MD for:  persistant nausea and vomiting   Complete by: As directed    Call MD for:  severe uncontrolled pain   Complete by: As directed    Call MD for:  temperature >100.4    Complete by: As directed        Signed: Orvis Brill, MD 04/04/2021, 1:08 PM   Pager: [phone]

## 2021-04-04 NOTE — Interval H&P Note (Signed)
History and Physical Interval Note:  04/04/2021 10:22 AM  Madeline Warren  has presented today for surgery, with the diagnosis of Left-sided colitis.  Abdominal pain and bloody stool..  The various methods of treatment have been discussed with the patient and family. After consideration of risks, benefits and other options for treatment, the patient has consented to  Procedure(s): COLONOSCOPY WITH PROPOFOL (N/A) as a surgical intervention.  The patient's history has been reviewed, patient examined, no change in status, stable for surgery.  I have reviewed the patient's chart and labs.  Questions were answered to the patient's satisfaction.     Thornton Park

## 2021-04-04 NOTE — ED Notes (Signed)
Going to ENDO now

## 2021-04-04 NOTE — Anesthesia Preprocedure Evaluation (Addendum)
Anesthesia Evaluation  Patient identified by MRN, date of birth, ID band Patient awake    Reviewed: Allergy & Precautions, NPO status , Patient's Chart, lab work & pertinent test results  History of Anesthesia Complications (+) PONV  Airway Mallampati: II  TM Distance: >3 FB Neck ROM: Full    Dental no notable dental hx.    Pulmonary neg pulmonary ROS,    Pulmonary exam normal        Cardiovascular negative cardio ROS   Rhythm:Regular Rate:Normal     Neuro/Psych negative neurological ROS  negative psych ROS   GI/Hepatic Neg liver ROS, Bloody stools, left sided colitis    Endo/Other  negative endocrine ROS  Renal/GU negative Renal ROS  negative genitourinary   Musculoskeletal negative musculoskeletal ROS (+)   Abdominal Normal abdominal exam  (+)   Peds  Hematology negative hematology ROS (+)   Anesthesia Other Findings   Reproductive/Obstetrics                            Anesthesia Physical Anesthesia Plan  ASA: 2  Anesthesia Plan: MAC   Post-op Pain Management:    Induction: Intravenous  PONV Risk Score and Plan: 3 and Propofol infusion and Treatment may vary due to age or medical condition  Airway Management Planned: Simple Face Mask, Natural Airway and Nasal Cannula  Additional Equipment: None  Intra-op Plan:   Post-operative Plan:   Informed Consent: I have reviewed the patients History and Physical, chart, labs and discussed the procedure including the risks, benefits and alternatives for the proposed anesthesia with the patient or authorized representative who has indicated his/her understanding and acceptance.     Dental advisory given  Plan Discussed with: CRNA  Anesthesia Plan Comments: (Lab Results      Component                Value               Date                      WBC                      8.7                 04/04/2021                HGB                       14.0                04/04/2021                HCT                      43.5                04/04/2021                MCV                      92.6                04/04/2021                PLT  189                 04/04/2021           Lab Results      Component                Value               Date                      NA                       144                 04/04/2021                K                        3.9                 04/04/2021                CO2                      26                  04/04/2021                GLUCOSE                  89                  04/04/2021                BUN                      9                   04/04/2021                CREATININE               0.82                04/04/2021                CALCIUM                  8.7 (L)             04/04/2021                EGFR                     102                 12/07/2020                GFRNONAA                 >60                 04/04/2021          )       Anesthesia Quick Evaluation

## 2021-04-05 ENCOUNTER — Encounter (HOSPITAL_COMMUNITY): Payer: Self-pay | Admitting: Gastroenterology

## 2021-04-05 LAB — SURGICAL PATHOLOGY

## 2021-04-05 NOTE — Anesthesia Postprocedure Evaluation (Signed)
Anesthesia Post Note  Patient: Madeline Warren  Procedure(s) Performed: COLONOSCOPY WITH PROPOFOL BIOPSY     Patient location during evaluation: Endoscopy Anesthesia Type: MAC Level of consciousness: awake and alert Pain management: pain level controlled Vital Signs Assessment: post-procedure vital signs reviewed and stable Respiratory status: spontaneous breathing, nonlabored ventilation, respiratory function stable and patient connected to nasal cannula oxygen Cardiovascular status: stable and blood pressure returned to baseline Postop Assessment: no apparent nausea or vomiting Anesthetic complications: no   No notable events documented.  Last Vitals:  Vitals:   04/04/21 1235 04/04/21 1330  BP: (!) 122/59 119/66  Pulse: 70 74  Resp: 14 16  Temp:    SpO2: 99% 100%    Last Pain:  Vitals:   04/04/21 1347  TempSrc:   PainSc: 0-No pain                 Belenda Cruise P Remi Lopata

## 2021-04-07 DIAGNOSIS — R110 Nausea: Secondary | ICD-10-CM | POA: Diagnosis not present

## 2021-04-07 DIAGNOSIS — K55039 Acute (reversible) ischemia of large intestine, extent unspecified: Secondary | ICD-10-CM | POA: Diagnosis not present

## 2021-04-07 DIAGNOSIS — K5900 Constipation, unspecified: Secondary | ICD-10-CM | POA: Diagnosis not present

## 2021-04-07 DIAGNOSIS — K51511 Left sided colitis with rectal bleeding: Secondary | ICD-10-CM | POA: Diagnosis not present

## 2021-04-10 DIAGNOSIS — M25511 Pain in right shoulder: Secondary | ICD-10-CM | POA: Diagnosis not present

## 2021-04-25 DIAGNOSIS — M25511 Pain in right shoulder: Secondary | ICD-10-CM | POA: Diagnosis not present

## 2021-04-25 DIAGNOSIS — M25611 Stiffness of right shoulder, not elsewhere classified: Secondary | ICD-10-CM | POA: Diagnosis not present

## 2021-04-25 DIAGNOSIS — M542 Cervicalgia: Secondary | ICD-10-CM | POA: Diagnosis not present

## 2021-05-01 ENCOUNTER — Encounter (INDEPENDENT_AMBULATORY_CARE_PROVIDER_SITE_OTHER): Payer: Self-pay | Admitting: Bariatrics

## 2021-05-01 ENCOUNTER — Ambulatory Visit (INDEPENDENT_AMBULATORY_CARE_PROVIDER_SITE_OTHER): Payer: BC Managed Care – PPO | Admitting: Bariatrics

## 2021-05-01 ENCOUNTER — Other Ambulatory Visit: Payer: Self-pay

## 2021-05-01 VITALS — BP 121/80 | HR 64 | Temp 98.2°F | Ht 65.0 in | Wt 204.0 lb

## 2021-05-01 DIAGNOSIS — E559 Vitamin D deficiency, unspecified: Secondary | ICD-10-CM | POA: Diagnosis not present

## 2021-05-01 DIAGNOSIS — K529 Noninfective gastroenteritis and colitis, unspecified: Secondary | ICD-10-CM

## 2021-05-01 DIAGNOSIS — Z6833 Body mass index (BMI) 33.0-33.9, adult: Secondary | ICD-10-CM

## 2021-05-01 DIAGNOSIS — E7800 Pure hypercholesterolemia, unspecified: Secondary | ICD-10-CM

## 2021-05-01 DIAGNOSIS — E669 Obesity, unspecified: Secondary | ICD-10-CM

## 2021-05-01 DIAGNOSIS — E6609 Other obesity due to excess calories: Secondary | ICD-10-CM

## 2021-05-01 MED ORDER — VITAMIN D (ERGOCALCIFEROL) 1.25 MG (50000 UNIT) PO CAPS: 50000.0000 [IU] | ORAL_CAPSULE | ORAL | 0 refills | Status: DC

## 2021-05-01 NOTE — Progress Notes (Signed)
Chief Complaint:   OBESITY Madeline Warren is here to discuss her progress with her obesity treatment plan along with follow-up of her obesity related diagnoses. Madeline Warren is on keeping a food journal and adhering to recommended goals of 1200 calories and 80 grams of protein daily and states she is following her eating plan approximately 50% of the time. Madeline Warren states she is walking for 15-25 minutes 2 times per week.  Today's visit was #: 22 Starting weight: 237 lbs Starting date: 12/30/2019 Today's weight: 204 lbs Today's date: 05/01/2021 Total lbs lost to date: 33 Total lbs lost since last in-office visit: 0  Interim History: Madeline Warren is up 2 lbs as her daughter has been visiting and she states that she ate more.   Subjective:   1. Vitamin D deficiency Madeline Warren is taking Vitamin D as directed.   2. Elevated cholesterol Madeline Warren is not currently on medications.  3. Colitis Madeline Warren stopped Wegovy (Ozempic. 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  Assessment/Plan:   1. Vitamin D deficiency Low Vitamin D level contributes to fatigue and are associated with obesity, breast, and colon cancer. We will refill prescription Vitamin D for 1 month. [name] will follow-up for routine testing of Vitamin D, at least 2-3 times per year to avoid over-replacement.  - Vitamin D, Ergocalciferol, (DRISDOL) 1.25 MG (50000 UNIT) CAPS capsule; Take 1 capsule (50,000 Units total) by mouth every 7 (seven) days.  Dispense: 4 capsule; Refill: 0  2. Elevated cholesterol Cardiovascular risk and specific lipid/LDL goals reviewed.  We discussed several lifestyle modifications today. Spenser will limit saturated fats, and unpacked meats, and she will continue to work on her diet. Orders and follow up as documented in patient record.   Counseling Intensive lifestyle modifications are the first line treatment for this issue. Dietary changes: Increase soluble fiber. Decrease simple carbohydrates. Exercise changes:  Moderate to vigorous-intensity aerobic activity 150 minutes per week if tolerated. Lipid-lowering medications: see documented in medical record.  3. Colitis Madeline Warren will stop Wegovy/Ozempic. She will continue probiotics.  4. Obesity, BMI today 33.9 Madeline Warren is currently in the action stage of change. As such, her goal is to continue with weight loss efforts. She has agreed to keeping a food journal and adhering to recommended goals of 1200 calories and 80 grams of protein daily.   Meal planning, intentional eating, eliminate all carbohydrates/sweets, increase water.  Exercise goals: As is.  Behavioral modification strategies: increasing lean protein intake, decreasing simple carbohydrates, increasing vegetables, increasing water intake, decreasing eating out, no skipping meals, meal planning and cooking strategies, keeping healthy foods in the home, and planning for success.  Madeline Warren has agreed to follow-up with our clinic in 4 weeks. She was informed of the importance of frequent follow-up visits to maximize her success with intensive lifestyle modifications for her multiple health conditions.   Objective:   Blood pressure 121/80, pulse 64, temperature 98.2 F (36.8 C), height 5\' 5"  (1.651 m), weight 204 lb (92.5 kg), last menstrual period 12/22/2013, SpO2 100 %. Body mass index is 33.95 kg/m.  General: Cooperative, alert, well developed, in no acute distress. HEENT: Conjunctivae and lids unremarkable. Cardiovascular: Regular rhythm.  Lungs: Normal work of breathing. Neurologic: No focal deficits.   Lab Results  Component Value Date   CREATININE 0.82 04/04/2021   BUN 9 04/04/2021   NA 144 04/04/2021   K 3.9 04/04/2021   CL 110 04/04/2021   CO2 26 04/04/2021   Lab Results  Component Value Date  ALT 16 04/02/2021   AST 15 04/02/2021   ALKPHOS 43 04/02/2021   BILITOT 0.8 04/02/2021   Lab Results  Component Value Date   HGBA1C 5.1 04/04/2021   HGBA1C 5.2 06/21/2020    HGBA1C 5.6 12/30/2019   Lab Results  Component Value Date   INSULIN 14.8 12/07/2020   INSULIN 13.1 06/21/2020   INSULIN 17.3 12/30/2019   Lab Results  Component Value Date   TSH 3.190 06/21/2020   Lab Results  Component Value Date   CHOL 186 04/04/2021   HDL 45 04/04/2021   LDLCALC 134 (H) 04/04/2021   TRIG 33 04/04/2021   CHOLHDL 4.1 04/04/2021   Lab Results  Component Value Date   VD25OH 50.4 12/07/2020   VD25OH 32.5 06/21/2020   VD25OH 33.2 12/30/2019   Lab Results  Component Value Date   WBC 8.7 04/04/2021   HGB 14.0 04/04/2021   HCT 43.5 04/04/2021   MCV 92.6 04/04/2021   PLT 189 04/04/2021   No results found for: IRON, TIBC, FERRITIN  Attestation Statements:   Reviewed by clinician on day of visit: allergies, medications, problem list, medical history, surgical history, family history, social history, and previous encounter notes.   Wilhemena Durie, am acting as Location manager for [employer], DO.  I have reviewed the above documentation for accuracy and completeness, and I agree with the above. Jearld Lesch, DO

## 2021-05-08 DIAGNOSIS — M542 Cervicalgia: Secondary | ICD-10-CM | POA: Diagnosis not present

## 2021-05-08 DIAGNOSIS — M25511 Pain in right shoulder: Secondary | ICD-10-CM | POA: Diagnosis not present

## 2021-05-08 DIAGNOSIS — M25611 Stiffness of right shoulder, not elsewhere classified: Secondary | ICD-10-CM | POA: Diagnosis not present

## 2021-05-09 DIAGNOSIS — G8929 Other chronic pain: Secondary | ICD-10-CM | POA: Diagnosis not present

## 2021-05-09 DIAGNOSIS — M7751 Other enthesopathy of right foot: Secondary | ICD-10-CM | POA: Diagnosis not present

## 2021-05-09 DIAGNOSIS — M25571 Pain in right ankle and joints of right foot: Secondary | ICD-10-CM | POA: Diagnosis not present

## 2021-05-09 DIAGNOSIS — M25471 Effusion, right ankle: Secondary | ICD-10-CM | POA: Diagnosis not present

## 2021-05-11 DIAGNOSIS — M25611 Stiffness of right shoulder, not elsewhere classified: Secondary | ICD-10-CM | POA: Diagnosis not present

## 2021-05-11 DIAGNOSIS — M25511 Pain in right shoulder: Secondary | ICD-10-CM | POA: Diagnosis not present

## 2021-05-11 DIAGNOSIS — M542 Cervicalgia: Secondary | ICD-10-CM | POA: Diagnosis not present

## 2021-05-21 DIAGNOSIS — M25471 Effusion, right ankle: Secondary | ICD-10-CM | POA: Diagnosis not present

## 2021-05-21 DIAGNOSIS — M7751 Other enthesopathy of right foot: Secondary | ICD-10-CM | POA: Diagnosis not present

## 2021-05-21 DIAGNOSIS — M25571 Pain in right ankle and joints of right foot: Secondary | ICD-10-CM | POA: Diagnosis not present

## 2021-05-21 DIAGNOSIS — G8929 Other chronic pain: Secondary | ICD-10-CM | POA: Diagnosis not present

## 2021-05-22 DIAGNOSIS — M25611 Stiffness of right shoulder, not elsewhere classified: Secondary | ICD-10-CM | POA: Diagnosis not present

## 2021-05-22 DIAGNOSIS — M542 Cervicalgia: Secondary | ICD-10-CM | POA: Diagnosis not present

## 2021-05-22 DIAGNOSIS — M25511 Pain in right shoulder: Secondary | ICD-10-CM | POA: Diagnosis not present

## 2021-05-25 DIAGNOSIS — M542 Cervicalgia: Secondary | ICD-10-CM | POA: Diagnosis not present

## 2021-05-25 DIAGNOSIS — M25611 Stiffness of right shoulder, not elsewhere classified: Secondary | ICD-10-CM | POA: Diagnosis not present

## 2021-05-25 DIAGNOSIS — M25511 Pain in right shoulder: Secondary | ICD-10-CM | POA: Diagnosis not present

## 2021-05-26 ENCOUNTER — Ambulatory Visit (INDEPENDENT_AMBULATORY_CARE_PROVIDER_SITE_OTHER): Payer: BC Managed Care – PPO | Admitting: Gastroenterology

## 2021-05-26 ENCOUNTER — Encounter: Payer: Self-pay | Admitting: Gastroenterology

## 2021-05-26 VITALS — BP 120/72 | HR 73 | Ht 65.0 in | Wt 213.2 lb

## 2021-05-26 DIAGNOSIS — R1310 Dysphagia, unspecified: Secondary | ICD-10-CM | POA: Diagnosis not present

## 2021-05-26 DIAGNOSIS — K219 Gastro-esophageal reflux disease without esophagitis: Secondary | ICD-10-CM

## 2021-05-26 NOTE — Patient Instructions (Signed)
It was my pleasure to provide care to you today. Based on our discussion, I am providing you with my recommendations below: ? ?RECOMMENDATION(S):  ? ?ENDOSCOPY:  ? ?You have been scheduled for an endoscopy. Please follow written instructions given to you at your visit today. ? ?INHALERS:  ? ?If you use inhalers (even only as needed), please bring them with you on the day of your procedure. ? ?FOLLOW UP: ? ?After your procedure, you will receive a call from my office staff regarding my recommendation for follow up. ? ?BMI: ? ?If you are age 3 or younger, your body mass index should be between 19-25. Your Body mass index is 35.49 kg/m?Marland Kitchen If this is out of the aformentioned range listed, please consider follow up with your Primary Care Provider.  ? ?MY CHART: ? ?The  GI providers would like to encourage you to use [facility] to communicate with providers for non-urgent requests or questions.  Due to long hold times on the telephone, sending your provider a message by [facility] may be a faster and more efficient way to get a response.  Please allow 48 business hours for a response.  Please remember that this is for non-urgent requests.  ? ?Thank you for trusting me with your gastrointestinal care!   ? ?Thornton Park, MD, MPH ? ?

## 2021-05-26 NOTE — Progress Notes (Signed)
? ?Referring Provider: Nicoletta Dress, MD ?Primary Care Physician:  Nicoletta Dress, MD ? ? ?Reason for Consultation:  Reflux ? ? ?IMPRESSION:  ?Reflux with intermittent dysphagia.  EGD recommended for esophageal biopsies and possible dilation.  She wishes to pursue endoscopic evaluation prior to resuming pantoprazole. ? ?Ischemic colitis, likely due to Ozempic, resolved. ? ?Colon cancer surveillance.  Her next colonoscopy is due in 2033, earlier with new symptoms. ? ?PLAN: ?- Start pantoprazole 40 mg QAM after EGD if clinically appropriate ?- EGD ?- Obtain recent labs from Dr. Delena Bali ? ? ?HPI: Madeline Warren is a 51 y.o. female who presents for evaluation of reflux.  I met during her hospitalization for ischemic colitis in January. Colonoscopy at that time showed mild to moderate ischemic changes in the sigmoid colon, descending colon, splenic flexure, and distal transverse colon.  Biopsies confirmed ischemic colitis.  Without identifying other causes, Ozempic seemed the most likely culprit. Bowel habits are now back to normal with 2-3 BM daily.  ? ?She presents today to discuss reflux. She has a longstanding history of reflux over 5+ years but there is been some escalation in symptoms since she started Ozempic last spring.  Predominant symptom is brash.  Frequent phlegm - that she notices the most at night. No allergies or sinus drainage. Dysphagia localized to the sternum that may be occurring more frequently. Uses french fries and rice as the example of foods that results in symptoms.  No odynophagia, dysphonia, neck pain, sore throat, change in bowel habits, or blood in the stool.  Completed a 30-day trial of Protonix over the summer.  Currently treats her reflux with over-the-counter medications as needed. ? ?She is currently considering gastric bypass.  She became concerned about her symptoms while watching the educational videos in preparation for the surgery.   ? ?Father with a history of bleeding  peptic ulcer disease.  There is no known family history of colon cancer or polyps. No family history of stomach cancer or other GI malignancy. No family history of inflammatory bowel disease or celiac.  ? ?She works as a Research scientist (physical sciences) at [employer].  She lives with her husband in [place].  She does not smoke or drink alcohol.  She rarely uses NSAIDs. ? ? ?Past Medical History:  ?Diagnosis Date  ? Diffusion capacity of lung (dl), decreased   ? Endometrial polyp   ? Food allergy   ? avocado  ? Lower back pain   ? PONV (postoperative nausea and vomiting)   ? after c-section  ? Vitamin D deficiency   ? ? ?Past Surgical History:  ?Procedure Laterality Date  ? BIOPSY  04/04/2021  ? Procedure: BIOPSY;  Surgeon: Thornton Park, MD;  Location: [facility] ENDOSCOPY;  Service: Gastroenterology;;  ? [place]  ? COLONOSCOPY WITH PROPOFOL N/A 04/04/2021  ? Procedure: COLONOSCOPY WITH PROPOFOL;  Surgeon: Thornton Park, MD;  Location: [place];  Service: Gastroenterology;  Laterality: N/A;  ? DILATATION & CURRETTAGE/HYSTEROSCOPY WITH RESECTOCOPE N/A 01/07/2014  ? Procedure: [place];  Surgeon: Darlyn Chamber, MD;  Location: [facility];  Service: Gynecology;  Laterality: N/A;  ? POLYPECTOMY  2015  ? Benign endometrial polyp resected 2015  ? ? ?Prior to Admission medications   ?Medication Sig Start Date End Date Taking? Authorizing Provider  ?Multiple Vitamin (MULTIVITAMIN) tablet Take 1 tablet by mouth daily.   Yes [provider]  ?Vitamin D, Ergocalciferol, (DRISDOL) 1.25 MG (50000 UNIT) CAPS capsule  Take 1 capsule (50,000 Units total) by mouth every 7 (seven) days. 05/01/21  Yes Jearld Lesch A, DO  ? ? ?Current Outpatient Medications  ?Medication Sig Dispense Refill  ? Multiple Vitamin (MULTIVITAMIN) tablet Take 1 tablet by mouth daily.    ? Vitamin D, Ergocalciferol, (DRISDOL) 1.25 MG (50000 UNIT) CAPS capsule Take 1 capsule (50,000 Units  total) by mouth every 7 (seven) days. 4 capsule 0  ? ?No current facility-administered medications for this visit.  ? ? ?Allergies as of 05/26/2021 - Review Complete 05/26/2021  ?Allergen Reaction Noted  ? Avocado Nausea And Vomiting 01/05/2014  ? Amoxicillin Rash 01/05/2014  ? ? ?Family History  ?Problem Relation Age of Onset  ? Asthma Mother   ? Diabetes Mother   ? Sudden death Mother   ? Obesity Mother   ? High Cholesterol Father   ? Stroke Father   ? Colon cancer Neg Hx   ? Stomach cancer Neg Hx   ? Esophageal cancer Neg Hx   ? ? ?Social History  ? ?Socioeconomic History  ? Marital status: Married  ?  Spouse name: Elta Guadeloupe  ? Number of children: Not on file  ? Years of education: Not on file  ? Highest education level: Not on file  ?Occupational History  ? Occupation: Receptionist  ?Tobacco Use  ? Smoking status: Never  ? Smokeless tobacco: Never  ?Vaping Use  ? Vaping Use: Never used  ?Substance and Sexual Activity  ? Alcohol use: Yes  ?  Comment: rare  ? Drug use: No  ? Sexual activity: Not on file  ?Other Topics Concern  ? Not on file  ?Social History Narrative  ? Not on file  ? ?Social Determinants of Health  ? ?Financial Resource Strain: Not on file  ?Food Insecurity: Not on file  ?Transportation Needs: Not on file  ?Physical Activity: Not on file  ?Stress: Not on file  ?Social Connections: Not on file  ?Intimate Partner Violence: Not on file  ? ? ?Review of Systems: ?12 system ROS is negative except as noted above.  ? ?Physical Exam: ?General:   Alert,  well-nourished, pleasant and cooperative in NAD ?Head:  Normocephalic and atraumatic. ?Eyes:  Sclera clear, no icterus.   Conjunctiva pink. ?Ears:  Normal auditory acuity. ?Nose:  No deformity, discharge,  or lesions. ?Mouth:  No deformity or lesions.   ?Neck:  Supple; no masses or thyromegaly. ?Lungs:  Clear throughout to auscultation.   No wheezes. ?Heart:  Regular rate and rhythm; no murmurs. ?Abdomen:  Soft, nontender, nondistended, normal bowel sounds, no  rebound or guarding. No hepatosplenomegaly.   ?Rectal:  Deferred  ?Msk:  Symmetrical. No boney deformities ?LAD: No inguinal or umbilical LAD ?Extremities:  No clubbing or edema. ?Neurologic:  Alert and  oriented x4;  grossly nonfocal ?Skin:  Intact without significant lesions or rashes. ?Psych:  Alert and cooperative. Normal mood and affect. ? ? ? ? ?Evamarie Raetz L. Tarri Glenn, MD, MPH ?06/04/2021, 9:38 PM ? ? ? ?  ?

## 2021-05-28 ENCOUNTER — Encounter: Payer: Self-pay | Admitting: Certified Registered Nurse Anesthetist

## 2021-05-30 ENCOUNTER — Other Ambulatory Visit: Payer: Self-pay

## 2021-05-30 ENCOUNTER — Encounter (INDEPENDENT_AMBULATORY_CARE_PROVIDER_SITE_OTHER): Payer: Self-pay | Admitting: Bariatrics

## 2021-05-30 ENCOUNTER — Ambulatory Visit (INDEPENDENT_AMBULATORY_CARE_PROVIDER_SITE_OTHER): Payer: BC Managed Care – PPO | Admitting: Bariatrics

## 2021-05-30 VITALS — BP 121/82 | HR 83 | Temp 97.9°F | Ht 65.0 in | Wt 208.0 lb

## 2021-05-30 DIAGNOSIS — E669 Obesity, unspecified: Secondary | ICD-10-CM | POA: Diagnosis not present

## 2021-05-30 DIAGNOSIS — E8881 Metabolic syndrome: Secondary | ICD-10-CM

## 2021-05-30 DIAGNOSIS — E559 Vitamin D deficiency, unspecified: Secondary | ICD-10-CM

## 2021-05-30 DIAGNOSIS — Z6834 Body mass index (BMI) 34.0-34.9, adult: Secondary | ICD-10-CM

## 2021-05-30 DIAGNOSIS — E6609 Other obesity due to excess calories: Secondary | ICD-10-CM

## 2021-05-30 DIAGNOSIS — F5089 Other specified eating disorder: Secondary | ICD-10-CM

## 2021-05-30 MED ORDER — VITAMIN D (ERGOCALCIFEROL) 1.25 MG (50000 UNIT) PO CAPS: 50000.0000 [IU] | ORAL_CAPSULE | ORAL | 0 refills | Status: DC

## 2021-05-30 NOTE — Telephone Encounter (Signed)
Please review

## 2021-05-31 NOTE — Progress Notes (Signed)
? ? ? ?Chief Complaint:  ? ?OBESITY ?Madeline Warren is here to discuss her progress with her obesity treatment plan along with follow-up of her obesity related diagnoses. Madeline Warren is on keeping a food journal and adhering to recommended goals of 1200 calories and 80 grams of protein and states she is following her eating plan approximately 30% of the time. Madeline Warren states she is doing 0 minutes 0 times per week. ? ?Today's visit was #: 23 ?Starting weight: 237 lbs ?Starting date: 12/30/2019 ?Today's weight: 208 lbs ?Today's date: 05/30/2021 ?Total lbs lost to date: 29 lbs ?Total lbs lost since last in-office visit: 0 ? ?Interim History: Madeline Warren is up 4 lbs since her last visit. She had stopped the Ozempic (due to constipation).  ? ?Subjective:  ? ?1. Vitamin D deficiency ?Madeline Warren is currently taking Vitamin D. ? ?2. Insulin resistance ?Madeline Warren is not on medication currently.  ? ?3. Other disorder of eating ?Madeline Warren notes cravings. She is not on medication.  ? ?Assessment/Plan:  ? ?1. Vitamin D deficiency ?Low Vitamin D level contributes to fatigue and are associated with obesity, breast, and colon cancer. We will refill prescription Vitamin D 50,000 IU every week for 1 month with no refills and Madeline Warren will follow-up for routine testing of Vitamin D, at least 2-3 times per year to avoid over-replacement. ? ?- Vitamin D, Ergocalciferol, (DRISDOL) 1.25 MG (50000 UNIT) CAPS capsule; Take 1 capsule (50,000 Units total) by mouth every 7 (seven) days.  Dispense: 4 capsule; Refill: 0 ? ?2. Insulin resistance ?Madeline Warren will minimize carbohydrates (sweets and starches). We discussed Metformin and a handout was provided today. She will continue to work on weight loss, exercise, and decreasing simple carbohydrates to help decrease the risk of diabetes. Madeline Warren agreed to follow-up with [place] as directed to closely monitor her progress. ? ?3. Other disorder of eating ?Behavior modification techniques were discussed today to help Madeline Warren deal  with her emotional/non-hunger eating behaviors.  Orders and follow up as documented in patient record.  ? ?4. Obesity, BMI today 34.7 ?Madeline Warren is currently in the action stage of change. As such, her goal is to continue with weight loss efforts. She has agreed to keeping a food journal and adhering to recommended goals of 1200 calories and 80 grams of protein.  ? ?Madeline Warren will continue intentional eating and she will be mindful eating. She will start to journal.  ? ?Exercise goals:  Madeline Warren will start water aerobics.  ? ?Behavioral modification strategies: increasing lean protein intake, decreasing simple carbohydrates, increasing vegetables, increasing water intake, decreasing eating out, no skipping meals, meal planning and cooking strategies, keeping healthy foods in the home, and planning for success. ? ?Madeline Warren has agreed to follow-up with our clinic in 4 weeks. She was informed of the importance of frequent follow-up visits to maximize her success with intensive lifestyle modifications for her multiple health conditions.  ? ?Objective:  ? ?Blood pressure 121/82, pulse 83, temperature 97.9 ?F (36.6 ?C), height '5\' 5"'$  (1.651 m), weight 208 lb (94.3 kg), last menstrual period 12/22/2013, SpO2 99 %. ?Body mass index is 34.61 kg/m?. ? ?General: Cooperative, alert, well developed, in no acute distress. ?HEENT: Conjunctivae and lids unremarkable. ?Cardiovascular: Regular rhythm.  ?Lungs: Normal work of breathing. ?Neurologic: No focal deficits.  ? ?Lab Results  ?Component Value Date  ? CREATININE 0.82 04/04/2021  ? BUN 9 04/04/2021  ? NA 144 04/04/2021  ? K 3.9 04/04/2021  ? CL 110 04/04/2021  ? CO2 26 04/04/2021  ? ?Lab Results  ?  Component Value Date  ? ALT 16 04/02/2021  ? AST 15 04/02/2021  ? ALKPHOS 43 04/02/2021  ? BILITOT 0.8 04/02/2021  ? ?Lab Results  ?Component Value Date  ? HGBA1C 5.1 04/04/2021  ? HGBA1C 5.2 06/21/2020  ? HGBA1C 5.6 12/30/2019  ? ?Lab Results  ?Component Value Date  ? INSULIN 14.8  12/07/2020  ? INSULIN 13.1 06/21/2020  ? INSULIN 17.3 12/30/2019  ? ?Lab Results  ?Component Value Date  ? TSH 3.190 06/21/2020  ? ?Lab Results  ?Component Value Date  ? CHOL 186 04/04/2021  ? HDL 45 04/04/2021  ? LDLCALC 134 (H) 04/04/2021  ? TRIG 33 04/04/2021  ? CHOLHDL 4.1 04/04/2021  ? ?Lab Results  ?Component Value Date  ? VD25OH 50.4 12/07/2020  ? VD25OH 32.5 06/21/2020  ? VD25OH 33.2 12/30/2019  ? ?Lab Results  ?Component Value Date  ? WBC 8.7 04/04/2021  ? HGB 14.0 04/04/2021  ? HCT 43.5 04/04/2021  ? MCV 92.6 04/04/2021  ? PLT 189 04/04/2021  ? ?No results found for: IRON, TIBC, FERRITIN ? ?Attestation Statements:  ? ?Reviewed by clinician on day of visit: allergies, medications, problem list, medical history, surgical history, family history, social history, and previous encounter notes. ? ?I, Lizbeth Bark, RMA, am acting as transcriptionist for [employer], DO. ? ?I have reviewed the above documentation for accuracy and completeness, and I agree with the above. Jearld Lesch, DO ? ?

## 2021-06-01 ENCOUNTER — Encounter (INDEPENDENT_AMBULATORY_CARE_PROVIDER_SITE_OTHER): Payer: Self-pay | Admitting: Bariatrics

## 2021-06-02 ENCOUNTER — Ambulatory Visit (AMBULATORY_SURGERY_CENTER): Payer: BC Managed Care – PPO | Admitting: Gastroenterology

## 2021-06-02 ENCOUNTER — Encounter: Payer: Self-pay | Admitting: Gastroenterology

## 2021-06-02 VITALS — BP 133/83 | HR 62 | Temp 97.3°F | Resp 11 | Ht 65.0 in | Wt 208.0 lb

## 2021-06-02 DIAGNOSIS — K2960 Other gastritis without bleeding: Secondary | ICD-10-CM | POA: Diagnosis not present

## 2021-06-02 DIAGNOSIS — K2289 Other specified disease of esophagus: Secondary | ICD-10-CM | POA: Diagnosis not present

## 2021-06-02 DIAGNOSIS — K2980 Duodenitis without bleeding: Secondary | ICD-10-CM

## 2021-06-02 DIAGNOSIS — R1310 Dysphagia, unspecified: Secondary | ICD-10-CM | POA: Diagnosis not present

## 2021-06-02 DIAGNOSIS — K219 Gastro-esophageal reflux disease without esophagitis: Secondary | ICD-10-CM

## 2021-06-02 DIAGNOSIS — K319 Disease of stomach and duodenum, unspecified: Secondary | ICD-10-CM | POA: Diagnosis not present

## 2021-06-02 MED ORDER — SODIUM CHLORIDE 0.9 % IV SOLN: 500.0000 mL | Freq: Once | INTRAVENOUS | Status: DC

## 2021-06-02 NOTE — Op Note (Signed)
[facility] ?Patient Name: Madeline Warren ?Procedure Date: 06/02/2021 3:30 PM ?MRN: [id number] ?Endoscopist: Thornton Park MD, MD ?Age: 51 ?Referring MD:  ?Date of Birth: [birth year] ?Gender: Female ?Account #: [id number] ?Procedure:                Upper GI endoscopy ?Indications:              Dysphagia ?Medicines:                Monitored Anesthesia Care ?Procedure:                Pre-Anesthesia Assessment: ?                          - Prior to the procedure, a History and Physical  ?                          was performed, and patient medications and  ?                          allergies were reviewed. The patient's tolerance of  ?                          previous anesthesia was also reviewed. The risks  ?                          and benefits of the procedure and the sedation  ?                          options and risks were discussed with the patient.  ?                          All questions were answered, and informed consent  ?                          was obtained. Prior Anticoagulants: The patient has  ?                          taken no previous anticoagulant or antiplatelet  ?                          agents. ASA Grade Assessment: II - A patient with  ?                          mild systemic disease. After reviewing the risks  ?                          and benefits, the patient was deemed in  ?                          satisfactory condition to undergo the procedure. ?                          After obtaining informed consent, the endoscope was  ?  passed under direct vision. Throughout the  ?                          procedure, the patient's blood pressure, pulse, and  ?                          oxygen saturations were monitored continuously. The  ?                          GIF [id number] #[id number] was introduced through the  ?                          mouth, and advanced to the third part of duodenum.  ?                          The upper GI endoscopy was accomplished without   ?                          difficulty. The patient tolerated the procedure  ?                          well. ?Scope In: ?Scope Out: ?Findings:                 No endoscopic abnormality was evident in the  ?                          esophagus to explain the patient's complaint of  ?                          dysphagia. It was decided, however, to proceed with  ?                          dilation of the lower third of the esophagus. A TTS  ?                          dilator was passed through the scope. Dilation with  ?                          a 16-17-18 mm balloon dilator was performed to 18  ?                          mm. The dilation site was examined and showed no  ?                          change. After dilation, biopsies were obtained from  ?                          the mid/proximal and distal esophagus with cold  ?                          forceps for histology of suspected eosinophilic  ?  esophagitis. ?                          The entire examined stomach was normal. Biopsies  ?                          were taken from the antrum, body, and fundus with a  ?                          cold forceps for histology. Estimated blood loss  ?                          was minimal. ?                          The examined duodenum was normal. Biopsies were  ?                          taken with a cold forceps for histology. Estimated  ?                          blood loss was minimal. ?                          The cardia and gastric fundus were normal on  ?                          retroflexion. ?                          The exam was otherwise without abnormality. ?Complications:            No immediate complications. ?Estimated Blood Loss:     Estimated blood loss was minimal. ?Impression:               - No endoscopic esophageal abnormality to explain  ?                          patient's dysphagia. Esophagus dilated. Dilated.  ?                          Biopsied. ?                           - Normal stomach. Biopsied. ?                          - Normal examined duodenum. Biopsied. ?                          - The examination was otherwise normal. ?Recommendation:           - Patient has a contact number available for  ?                          emergencies. The signs and symptoms of potential  ?  delayed complications were discussed with the  ?                          patient. Return to normal activities tomorrow.  ?                          Written discharge instructions were provided to the  ?                          patient. ?                          - Resume previous diet. ?                          - Continue present medications. ?                          - Await pathology results. ?Thornton Park MD, MD ?06/02/2021 4:04:59 PM ?This report has been signed electronically. ?

## 2021-06-02 NOTE — Progress Notes (Signed)
1543 Robinul 0.1 mg IV given due large amount of secretions upon assessment.  MD made aware, vss  ?

## 2021-06-02 NOTE — Progress Notes (Signed)
Called to room to assist during endoscopic procedure.  Patient ID and intended procedure confirmed with present staff. Received instructions for my participation in the procedure from the performing physician.  

## 2021-06-02 NOTE — Progress Notes (Signed)
Report given to PACU, vss 

## 2021-06-02 NOTE — Progress Notes (Signed)
Pt's states no medical or surgical changes since previsit or office visit. 

## 2021-06-02 NOTE — Progress Notes (Signed)
Indications for EGD: Reflux with intermittent dysphagia ? ?Please see the 05/26/21 office note for complete details. There has been no change in history or physical exam. She remains an appropriate candidate for monitored anesthesia care in the endoscopy center.  ?

## 2021-06-02 NOTE — Patient Instructions (Signed)
Thank you for coming in to see [place] today! ?Follow a soft diet for the remainder of the day today, handout given.  Return to normal eating tomorrow. ?Return to normal daily activities tomorrow. ?Await biopsy results, approximately 1-2 weeks. ? ? ?YOU HAD AN ENDOSCOPIC PROCEDURE TODAY AT [facility]:   Refer to the procedure report that was given to you for any specific questions about what was found during the examination.  If the procedure report does not answer your questions, please call your gastroenterologist to clarify.  If you requested that your care partner not be given the details of your procedure findings, then the procedure report has been included in a sealed envelope for you to review at your convenience later. ? ?YOU SHOULD EXPECT: Some feelings of bloating in the abdomen. Passage of more gas than usual.  Walking can help get rid of the air that was put into your GI tract during the procedure and reduce the bloating. If you had a lower endoscopy (such as a colonoscopy or flexible sigmoidoscopy) you may notice spotting of blood in your stool or on the toilet paper. If you underwent a bowel prep for your procedure, you may not have a normal bowel movement for a few days. ? ?Please Note:  You might notice some irritation and congestion in your nose or some drainage.  This is from the oxygen used during your procedure.  There is no need for concern and it should clear up in a day or so. ? ?SYMPTOMS TO REPORT IMMEDIATELY: ? ? ? ?Following upper endoscopy (EGD) ? Vomiting of blood or coffee ground material ? New chest pain or pain under the shoulder blades ? Painful or persistently difficult swallowing ? New shortness of breath ? Fever of 100?F or higher ? Black, tarry-looking stools ? ?For urgent or emergent issues, a gastroenterologist can be reached at any hour by calling [phone]. ?Do not use MyChart messaging for urgent concerns.  ? ? ?DIET:  We do recommend a small meal at first,  but then you may proceed to your regular diet.  Drink plenty of fluids but you should avoid alcoholic beverages for 24 hours. ? ?ACTIVITY:  You should plan to take it easy for the rest of today and you should NOT DRIVE or use heavy machinery until tomorrow (because of the sedation medicines used during the test).   ? ?FOLLOW UP: ?Our staff will call the number listed on your records 48-72 hours following your procedure to check on you and address any questions or concerns that you may have regarding the information given to you following your procedure. If we do not reach you, we will leave a message.  We will attempt to reach you two times.  During this call, we will ask if you have developed any symptoms of COVID 19. If you develop any symptoms (ie: fever, flu-like symptoms, shortness of breath, cough etc.) before then, please call [phone].  If you test positive for Covid 19 in the 2 weeks post procedure, please call and report this information to [place].   ? ?If any biopsies were taken you will be contacted by phone or by letter within the next 1-3 weeks.  Please call us at [phone] if you have not heard about the biopsies in 3 weeks.  ? ? ?SIGNATURES/CONFIDENTIALITY: ?You and/or your care partner have signed paperwork which will be entered into your electronic medical record.  These signatures attest to the fact that that the  information above on your After Visit Summary has been reviewed and is understood.  Full responsibility of the confidentiality of this discharge information lies with you and/or your care-partner.  ?

## 2021-06-04 ENCOUNTER — Encounter: Payer: Self-pay | Admitting: Gastroenterology

## 2021-06-05 ENCOUNTER — Telehealth: Payer: Self-pay | Admitting: Gastroenterology

## 2021-06-05 NOTE — Telephone Encounter (Signed)
Reviewed labs from Dr. Elissa Hefty from 04/07/2021 that show normal CBC with a hemoglobin of 14.2, MCV 87, RDW 13.1, platelets 225.  BMP is normal.  ? ?Please let the patient know I received a copy of her recent labs.  They are normal.  I have no new recommendations based on these results.  Thank you. ?

## 2021-06-06 ENCOUNTER — Telehealth: Payer: Self-pay

## 2021-06-06 NOTE — Telephone Encounter (Signed)
?  Follow up Call- ? ? ?  06/02/2021  ?  2:58 PM  ?Call back number  ?Post procedure Call Back phone  # [phone]  ?Permission to leave phone message Yes  ?  ? ?Patient questions: ? ?Do you have a fever, pain , or abdominal swelling? No. ?Pain Score  0 * ? ?Have you tolerated food without any problems? Yes.   ? ?Have you been able to return to your normal activities? Yes.   ? ?Do you have any questions about your discharge instructions: ?Diet   No. ?Medications  No. ?Follow up visit  No. ? ?Do you have questions or concerns about your Care? No. ? ?Actions: ?* If pain score is 4 or above: ?No action needed, pain <4. ? ? ?

## 2021-06-06 NOTE — Telephone Encounter (Signed)
Please review

## 2021-06-06 NOTE — Telephone Encounter (Signed)
Called pt and informed of Dr. Tarri Glenn response below. Verbalized acceptance and understanding. ?

## 2021-06-08 DIAGNOSIS — M25611 Stiffness of right shoulder, not elsewhere classified: Secondary | ICD-10-CM | POA: Diagnosis not present

## 2021-06-08 DIAGNOSIS — M25511 Pain in right shoulder: Secondary | ICD-10-CM | POA: Diagnosis not present

## 2021-06-08 DIAGNOSIS — M542 Cervicalgia: Secondary | ICD-10-CM | POA: Diagnosis not present

## 2021-06-09 ENCOUNTER — Other Ambulatory Visit: Payer: Self-pay

## 2021-06-09 DIAGNOSIS — K219 Gastro-esophageal reflux disease without esophagitis: Secondary | ICD-10-CM

## 2021-06-09 MED ORDER — PANTOPRAZOLE SODIUM 40 MG PO TBEC: 40.0000 mg | DELAYED_RELEASE_TABLET | Freq: Every morning | ORAL | 3 refills | Status: DC

## 2021-06-12 DIAGNOSIS — M542 Cervicalgia: Secondary | ICD-10-CM | POA: Diagnosis not present

## 2021-06-12 DIAGNOSIS — M25511 Pain in right shoulder: Secondary | ICD-10-CM | POA: Diagnosis not present

## 2021-06-12 DIAGNOSIS — M25611 Stiffness of right shoulder, not elsewhere classified: Secondary | ICD-10-CM | POA: Diagnosis not present

## 2021-06-13 DIAGNOSIS — M25511 Pain in right shoulder: Secondary | ICD-10-CM | POA: Diagnosis not present

## 2021-06-14 DIAGNOSIS — M542 Cervicalgia: Secondary | ICD-10-CM | POA: Diagnosis not present

## 2021-06-14 DIAGNOSIS — M5459 Other low back pain: Secondary | ICD-10-CM | POA: Diagnosis not present

## 2021-06-14 DIAGNOSIS — M546 Pain in thoracic spine: Secondary | ICD-10-CM | POA: Diagnosis not present

## 2021-06-19 DIAGNOSIS — M5459 Other low back pain: Secondary | ICD-10-CM | POA: Diagnosis not present

## 2021-06-19 DIAGNOSIS — M546 Pain in thoracic spine: Secondary | ICD-10-CM | POA: Diagnosis not present

## 2021-06-19 DIAGNOSIS — M542 Cervicalgia: Secondary | ICD-10-CM | POA: Diagnosis not present

## 2021-06-21 DIAGNOSIS — M542 Cervicalgia: Secondary | ICD-10-CM | POA: Diagnosis not present

## 2021-06-21 DIAGNOSIS — Z124 Encounter for screening for malignant neoplasm of cervix: Secondary | ICD-10-CM | POA: Diagnosis not present

## 2021-06-21 DIAGNOSIS — Z1151 Encounter for screening for human papillomavirus (HPV): Secondary | ICD-10-CM | POA: Diagnosis not present

## 2021-06-21 DIAGNOSIS — M546 Pain in thoracic spine: Secondary | ICD-10-CM | POA: Diagnosis not present

## 2021-06-21 DIAGNOSIS — Z1231 Encounter for screening mammogram for malignant neoplasm of breast: Secondary | ICD-10-CM | POA: Diagnosis not present

## 2021-06-21 DIAGNOSIS — M5459 Other low back pain: Secondary | ICD-10-CM | POA: Diagnosis not present

## 2021-06-21 DIAGNOSIS — Z01419 Encounter for gynecological examination (general) (routine) without abnormal findings: Secondary | ICD-10-CM | POA: Diagnosis not present

## 2021-06-21 DIAGNOSIS — Z6836 Body mass index (BMI) 36.0-36.9, adult: Secondary | ICD-10-CM | POA: Diagnosis not present

## 2021-06-27 DIAGNOSIS — M542 Cervicalgia: Secondary | ICD-10-CM | POA: Diagnosis not present

## 2021-06-27 DIAGNOSIS — M546 Pain in thoracic spine: Secondary | ICD-10-CM | POA: Diagnosis not present

## 2021-06-27 DIAGNOSIS — M5459 Other low back pain: Secondary | ICD-10-CM | POA: Diagnosis not present

## 2021-06-28 DIAGNOSIS — M25611 Stiffness of right shoulder, not elsewhere classified: Secondary | ICD-10-CM | POA: Diagnosis not present

## 2021-06-28 DIAGNOSIS — M542 Cervicalgia: Secondary | ICD-10-CM | POA: Diagnosis not present

## 2021-06-28 DIAGNOSIS — M25511 Pain in right shoulder: Secondary | ICD-10-CM | POA: Diagnosis not present

## 2021-07-05 DIAGNOSIS — H60331 Swimmer's ear, right ear: Secondary | ICD-10-CM | POA: Diagnosis not present

## 2021-07-06 DIAGNOSIS — M25511 Pain in right shoulder: Secondary | ICD-10-CM | POA: Diagnosis not present

## 2021-07-06 DIAGNOSIS — M25611 Stiffness of right shoulder, not elsewhere classified: Secondary | ICD-10-CM | POA: Diagnosis not present

## 2021-07-06 DIAGNOSIS — M542 Cervicalgia: Secondary | ICD-10-CM | POA: Diagnosis not present

## 2021-07-10 ENCOUNTER — Ambulatory Visit: Payer: BC Managed Care – PPO | Admitting: Physician Assistant

## 2021-07-10 ENCOUNTER — Encounter (INDEPENDENT_AMBULATORY_CARE_PROVIDER_SITE_OTHER): Payer: Self-pay | Admitting: Bariatrics

## 2021-07-10 ENCOUNTER — Ambulatory Visit (INDEPENDENT_AMBULATORY_CARE_PROVIDER_SITE_OTHER): Payer: BC Managed Care – PPO | Admitting: Bariatrics

## 2021-07-10 VITALS — BP 118/81 | HR 71 | Temp 98.1°F | Ht 65.0 in | Wt 213.0 lb

## 2021-07-10 DIAGNOSIS — E559 Vitamin D deficiency, unspecified: Secondary | ICD-10-CM

## 2021-07-10 DIAGNOSIS — E7800 Pure hypercholesterolemia, unspecified: Secondary | ICD-10-CM | POA: Diagnosis not present

## 2021-07-10 DIAGNOSIS — Z6835 Body mass index (BMI) 35.0-35.9, adult: Secondary | ICD-10-CM

## 2021-07-10 DIAGNOSIS — E669 Obesity, unspecified: Secondary | ICD-10-CM

## 2021-07-10 MED ORDER — VITAMIN D (ERGOCALCIFEROL) 1.25 MG (50000 UNIT) PO CAPS: 50000.0000 [IU] | ORAL_CAPSULE | ORAL | 0 refills | Status: DC

## 2021-07-11 DIAGNOSIS — M25511 Pain in right shoulder: Secondary | ICD-10-CM | POA: Diagnosis not present

## 2021-07-11 DIAGNOSIS — M25611 Stiffness of right shoulder, not elsewhere classified: Secondary | ICD-10-CM | POA: Diagnosis not present

## 2021-07-11 DIAGNOSIS — M542 Cervicalgia: Secondary | ICD-10-CM | POA: Diagnosis not present

## 2021-07-13 ENCOUNTER — Ambulatory Visit (INDEPENDENT_AMBULATORY_CARE_PROVIDER_SITE_OTHER): Payer: BC Managed Care – PPO | Admitting: Gastroenterology

## 2021-07-13 ENCOUNTER — Encounter: Payer: Self-pay | Admitting: Gastroenterology

## 2021-07-13 VITALS — BP 130/60 | HR 71 | Ht 65.0 in | Wt 218.0 lb

## 2021-07-13 DIAGNOSIS — K219 Gastro-esophageal reflux disease without esophagitis: Secondary | ICD-10-CM | POA: Diagnosis not present

## 2021-07-13 NOTE — Patient Instructions (Addendum)
It was my pleasure to provide care to you today. Based on our discussion, I am providing you with my recommendations below: ? ?RECOMMENDATION(S):  ? ?We discussed continuing pantoprazole 40 mg every morning. I would not stop this all together. If you want to try to stop it, please taper off of it over many days.  ? ?You may also want to use Pepcid AC over the counter for breakthrough symptoms. ? ?We discussed talking to a bariatric surgeon.   ? ?REFERRAL: ? ?A referral, your demographics, a copy of your insurance card and your records will be sent to Dr. Evorn Gong. You will receive a call from their office regarding the date, time and location of your appointment. ? ? ?FOLLOW UP: ? ?I would like for you to follow up with me as needed. Please call the office at [phone] to schedule your appointment. ? ?BMI: ? ?If you are age 75 or older, your body mass index should be between 23-30. Your Body mass index is 36.28 kg/m?Marland Kitchen If this is out of the aforementioned range listed, please consider follow up with your Primary Care Provider. ? ?If you are age 40 or younger, your body mass index should be between 19-25. Your Body mass index is 36.28 kg/m?Marland Kitchen If this is out of the aformentioned range listed, please consider follow up with your Primary Care Provider.  ? ?MY CHART: ? ?The [facility] providers would like to encourage you to use [facility] to communicate with providers for non-urgent requests or questions.  Due to long hold times on the telephone, sending your provider a message by [facility] may be a faster and more efficient way to get a response.  Please allow 48 business hours for a response.  Please remember that this is for non-urgent requests.  ? ?Thank you for trusting me with your gastrointestinal care!   ? ?Thornton Park, MD, MPH ? ?

## 2021-07-13 NOTE — Progress Notes (Signed)
? ?Referring Provider: Nicoletta Dress, MD ?Primary Care Physician:  Nicoletta Dress, MD ? ? ?Chief complaint:  Reflux ? ? ?IMPRESSION:  ?Reflux with intermittent dysphagia.  EGD showed mild esophagitis, gastritis, and duodenitis. Improved dysphagia following empiric dilation. We reviewed her procedure note and pathology results. Clinically improving on PPI BID. No alarm features. We discussed goal of using the lowest dose of PPI needed to control her symptoms. Recommended a trial of medication taper as opposed to abrupt discontinuation if she feels that her symptoms are well controlled enough to cut back.  ? ?Ischemic colitis, likely due to Ozempic, resolved. No ongoing symptoms.  ? ?Interest in bariatric surgery. BMI is 36 and she has reflux. She has heard good things about Dr. Evorn Gong from friends and family.  ? ?Colon cancer surveillance.  Her next colonoscopy is due in 2033, earlier with new symptoms. ? ?PLAN: ?- Continue pantoprazole 40 mg QAM after EGD if clinically appropriate ?- Referral to Dr. Evorn Gong to consider bariatric ?- Follow-up in the office PRN ? ? ? ?HPI: REVONDA MENTER is a 51 y.o. female who returns in follow-up after endoscopic evaluation of reflux.  I initially met Sophea during her hospitalization in 03/2021 for ischemic colitis thought to be due to [place]. She was seen 05/26/21 for reflux and had an EGD performed 06/02/21.   ? ?She has a longstanding history of reflux over 5+ years but there ad been some escalation in symptoms since she started Ozempic last spring.  Predominant symptom is brash and dysphagia localized to the sternal notch.  Frequent phlegm - that she notices the most at night. No allergies or sinus drainage. There were no alarm features.  ? ?EGD was endoscopically normal. Empiric dilation performed with an 83m TTS balloon with no resistance. Biopsies of the esophagus showed reflux esophagitis, gastric biopsies showed gastropathy, and duodenal biopsies showed mild peptic  duodenitis.  ? ?Returns today in scheduled follow-up after endoscopic evaluation. Symptoms are less frequent. But, she continues to have intermittent dysphagia localized to the sternal notch. Brash has improved on pantoprazole 40 mg BID.  ? ?Symptoms recur if she misses a dose.  ? ?She follows a vegan diet and is frustrated by the recurrent weight plateaus despite repeat attempts with Weight [employer] and [facility]. She would like to consider bariatric surgery.  ? ?She does not smoke or drink alcohol.  She rarely uses NSAIDs. ? ?No new complaints or concerns.  ? ? ?Past Medical History:  ?Diagnosis Date  ? Diffusion capacity of lung (dl), decreased   ? Endometrial polyp   ? Food allergy   ? avocado  ? Lower back pain   ? PONV (postoperative nausea and vomiting)   ? after c-section  ? Vitamin D deficiency   ? ? ?Past Surgical History:  ?Procedure Laterality Date  ? BIOPSY  04/04/2021  ? Procedure: BIOPSY;  Surgeon: BThornton Park MD;  [facility];  Service: Gastroenterology;;  ? [place] ? COLONOSCOPY WITH PROPOFOL N/A 04/04/2021  ? Procedure: COLONOSCOPY WITH PROPOFOL;  Surgeon: BThornton Park MD;  [facility]  Service: Gastroenterology;  Laterality: N/A;  ? DILATATION & CURRETTAGE/HYSTEROSCOPY WITH RESECTOCOPE N/A 01/07/2014  ? Procedure: [place]  Surgeon: JDarlyn Chamber MD;  [facility]  Service: Gynecology;  Laterality: N/A;  ? POLYPECTOMY  2015  ? Benign endometrial polyp resected 2015  ? ? ?Prior to Admission medications   ?Medication  Sig Start Date End Date Taking? Authorizing Provider  ?Multiple Vitamin (MULTIVITAMIN) tablet Take 1 tablet by mouth daily.   Yes [provider]  ?Vitamin D, Ergocalciferol, (DRISDOL) 1.25 MG (50000 UNIT) CAPS capsule Take 1 capsule (50,000 Units total) by mouth every 7 (seven) days. 05/01/21  Yes Jearld Lesch A, DO  ? ? ?Current  Outpatient Medications  ?Medication Sig Dispense Refill  ? Multiple Vitamin (MULTIVITAMIN) tablet Take 1 tablet by mouth daily.    ? pantoprazole (PROTONIX) 40 MG tablet Take 1 tablet (40 mg total) by mouth every morning. 90 tablet 3  ? Vitamin D, Ergocalciferol, (DRISDOL) 1.25 MG (50000 UNIT) CAPS capsule Take 1 capsule (50,000 Units total) by mouth every 7 (seven) days. 4 capsule 0  ? ?No current facility-administered medications for this visit.  ? ? ?Allergies as of 07/13/2021 - Review Complete 07/13/2021  ?Allergen Reaction Noted  ? Avocado Nausea And Vomiting 01/05/2014  ? Amoxicillin Rash 01/05/2014  ? ? ?Family History  ?Problem Relation Age of Onset  ? Asthma Mother   ? Diabetes Mother   ? Sudden death Mother   ? Obesity Mother   ? High Cholesterol Father   ? Stroke Father   ? Colon cancer Neg Hx   ? Stomach cancer Neg Hx   ? Esophageal cancer Neg Hx   ? ? ? ? ?Physical Exam: ?General:   Alert,  well-nourished, pleasant and cooperative in NAD ?Head:  Normocephalic and atraumatic. ?Eyes:  Sclera clear, no icterus.   Conjunctiva pink. ?Abdomen:  Soft, nontender, nondistended, normal bowel sounds, no rebound or guarding. No hepatosplenomegaly.   ?Rectal:  Deferred  ?Msk:  Symmetrical. No boney deformities ?LAD: No inguinal or umbilical LAD ?Extremities:  No clubbing or edema. ?Neurologic:  Alert and  oriented x4;  grossly nonfocal ?Skin:  Intact without significant lesions or rashes. ?Psych:  Alert and cooperative. Normal mood and affect. ? ? ? ? ?Wilhelmena Zea L. Tarri Glenn, MD, MPH ?07/13/2021, 3:28 PM ? ? ? ?  ?

## 2021-07-17 NOTE — Progress Notes (Signed)
? ? ? ?Chief Complaint:  ? ?OBESITY ?Madeline Warren is here to discuss her progress with her obesity treatment plan along with follow-up of her obesity related diagnoses. Madeline Warren is on keeping a food journal and adhering to recommended goals of 1200 calories and 80 grams of protein and states she is following her eating plan approximately 50% of the time. Madeline Warren states she is doing water aerobics and walking for 45 minutes 2 times per week. ? ?Today's visit was #: 24 ?Starting weight: 237 lbs ?Starting date: 12/30/2019 ?Today's weight: 213 lbs ?Today's date: 07/10/2021 ?Total lbs lost to date: 24 lbs ?Total lbs lost since last in-office visit: 0 ? ?Interim History: Madeline Warren is up 5 lbs since her last visit. Per the bioimpedance scale she is up 2 lbs of water weight.  ? ?Subjective:  ? ?1. Vitamin D deficiency ?Madeline Warren is taking Vitamin D as directed.  ? ?2. Elevated cholesterol ?Madeline Warren is not on medications currently.  ? ?Assessment/Plan:  ? ?1. Vitamin D deficiency ?Low Vitamin D level contributes to fatigue and are associated with obesity, breast, and colon cancer. We will refill prescription Vitamin D 50,000 IU every week for 1 month with no refills and Madeline Warren will follow-up for routine testing of Vitamin D, at least 2-3 times per year to avoid over-replacement. ? ?- Vitamin D, Ergocalciferol, (DRISDOL) 1.25 MG (50000 UNIT) CAPS capsule; Take 1 capsule (50,000 Units total) by mouth every 7 (seven) days.  Dispense: 4 capsule; Refill: 0 ? ?2. Elevated cholesterol ?Cardiovascular risk and specific lipid/LDL goals reviewed.  We discussed several lifestyle modifications today and Madeline Warren will continue to work on diet, exercise and weight loss efforts. She will have no trans fats, she will minimize all saturated fats except dairy and unprocessed meats. Orders and follow up as documented in patient record.  ? ?Counseling ?Intensive lifestyle modifications are the first line treatment for this issue. ?Dietary changes: Increase  soluble fiber. Decrease simple carbohydrates. ?Exercise changes: Moderate to vigorous-intensity aerobic activity 150 minutes per week if tolerated. ?Lipid-lowering medications: see documented in medical record. ? ?3. Obesity, current BMI 35.4 ?Madeline Warren is currently in the action stage of change. As such, her goal is to continue with weight loss efforts. She has agreed to keeping a food journal and adhering to recommended goals of 1200 calories and 80 grams of protein.  ? ?Madeline Warren will get back to the plan and she will adhere at least 80-90%. She will be mindful eating. She will increase her water intake. She will continue Vegan eating.  ? ?Exercise goals:  As is. ? ?Behavioral modification strategies: increasing lean protein intake, decreasing simple carbohydrates, increasing vegetables, increasing water intake, decreasing eating out, no skipping meals, meal planning and cooking strategies, keeping healthy foods in the home, and planning for success. ? ?Madeline Warren has agreed to follow-up with our clinic in 4-5 weeks. She was informed of the importance of frequent follow-up visits to maximize her success with intensive lifestyle modifications for her multiple health conditions.  ? ?Objective:  ? ?Blood pressure 118/81, pulse 71, temperature 98.1 ?F (36.7 ?C), height '5\' 5"'$  (1.651 m), weight 213 lb (96.6 kg), last menstrual period 12/22/2013, SpO2 98 %. ?Body mass index is 35.45 kg/m?. ? ?General: Cooperative, alert, well developed, in no acute distress. ?HEENT: Conjunctivae and lids unremarkable. ?Cardiovascular: Regular rhythm.  ?Lungs: Normal work of breathing. ?Neurologic: No focal deficits.  ? ?Lab Results  ?Component Value Date  ? CREATININE 0.82 04/04/2021  ? BUN 9 04/04/2021  ? NA 144 04/04/2021  ?  K 3.9 04/04/2021  ? CL 110 04/04/2021  ? CO2 26 04/04/2021  ? ?Lab Results  ?Component Value Date  ? ALT 16 04/02/2021  ? AST 15 04/02/2021  ? ALKPHOS 43 04/02/2021  ? BILITOT 0.8 04/02/2021  ? ?Lab Results  ?Component  Value Date  ? HGBA1C 5.1 04/04/2021  ? HGBA1C 5.2 06/21/2020  ? HGBA1C 5.6 12/30/2019  ? ?Lab Results  ?Component Value Date  ? INSULIN 14.8 12/07/2020  ? INSULIN 13.1 06/21/2020  ? INSULIN 17.3 12/30/2019  ? ?Lab Results  ?Component Value Date  ? TSH 3.190 06/21/2020  ? ?Lab Results  ?Component Value Date  ? CHOL 186 04/04/2021  ? HDL 45 04/04/2021  ? LDLCALC 134 (H) 04/04/2021  ? TRIG 33 04/04/2021  ? CHOLHDL 4.1 04/04/2021  ? ?Lab Results  ?Component Value Date  ? VD25OH 50.4 12/07/2020  ? VD25OH 32.5 06/21/2020  ? VD25OH 33.2 12/30/2019  ? ?Lab Results  ?Component Value Date  ? WBC 8.7 04/04/2021  ? HGB 14.0 04/04/2021  ? HCT 43.5 04/04/2021  ? MCV 92.6 04/04/2021  ? PLT 189 04/04/2021  ? ?No results found for: IRON, TIBC, FERRITIN ? ?Attestation Statements:  ? ?Reviewed by clinician on day of visit: allergies, medications, problem list, medical history, surgical history, family history, social history, and previous encounter notes. ? ?I, Lizbeth Bark, RMA, am acting as transcriptionist for [employer], DO. ? ?I have reviewed the above documentation for accuracy and completeness, and I agree with the above. Jearld Lesch, DO ? ?

## 2021-07-18 ENCOUNTER — Encounter (INDEPENDENT_AMBULATORY_CARE_PROVIDER_SITE_OTHER): Payer: Self-pay | Admitting: Bariatrics

## 2021-07-20 DIAGNOSIS — M546 Pain in thoracic spine: Secondary | ICD-10-CM | POA: Diagnosis not present

## 2021-07-20 DIAGNOSIS — M5459 Other low back pain: Secondary | ICD-10-CM | POA: Diagnosis not present

## 2021-07-20 DIAGNOSIS — M542 Cervicalgia: Secondary | ICD-10-CM | POA: Diagnosis not present

## 2021-07-27 DIAGNOSIS — M542 Cervicalgia: Secondary | ICD-10-CM | POA: Diagnosis not present

## 2021-07-27 DIAGNOSIS — M5459 Other low back pain: Secondary | ICD-10-CM | POA: Diagnosis not present

## 2021-07-27 DIAGNOSIS — M546 Pain in thoracic spine: Secondary | ICD-10-CM | POA: Diagnosis not present

## 2021-08-14 ENCOUNTER — Ambulatory Visit (INDEPENDENT_AMBULATORY_CARE_PROVIDER_SITE_OTHER): Payer: BC Managed Care – PPO | Admitting: Bariatrics

## 2021-08-14 ENCOUNTER — Encounter (INDEPENDENT_AMBULATORY_CARE_PROVIDER_SITE_OTHER): Payer: Self-pay | Admitting: Bariatrics

## 2021-08-14 VITALS — BP 122/81 | HR 69 | Temp 97.6°F | Ht 65.0 in | Wt 218.0 lb

## 2021-08-14 DIAGNOSIS — E559 Vitamin D deficiency, unspecified: Secondary | ICD-10-CM

## 2021-08-14 DIAGNOSIS — E7800 Pure hypercholesterolemia, unspecified: Secondary | ICD-10-CM | POA: Diagnosis not present

## 2021-08-14 DIAGNOSIS — E669 Obesity, unspecified: Secondary | ICD-10-CM

## 2021-08-14 DIAGNOSIS — Z6836 Body mass index (BMI) 36.0-36.9, adult: Secondary | ICD-10-CM | POA: Diagnosis not present

## 2021-08-14 MED ORDER — VITAMIN D (ERGOCALCIFEROL) 1.25 MG (50000 UNIT) PO CAPS: 50000.0000 [IU] | ORAL_CAPSULE | ORAL | 0 refills | Status: DC

## 2021-08-15 DIAGNOSIS — Z13820 Encounter for screening for osteoporosis: Secondary | ICD-10-CM | POA: Diagnosis not present

## 2021-08-15 NOTE — Progress Notes (Signed)
Chief Complaint:   OBESITY Madeline Warren is here to discuss her progress with her obesity treatment plan along with follow-up of her obesity related diagnoses. Madeline Warren is on keeping a food journal and adhering to recommended goals of 1200 calories and 80 grams of protein and states she is following her eating plan approximately 40% of the time. Madeline Warren states she is doing water aerobics and walking for 45 minutes 2 times per week.  Today's visit was #: 25 Starting weight: 237 lbs Starting date: 12/30/2019 Today's weight: 218 lbs Today's date: 08/14/2021 Total lbs lost to date: 19 lbs Total lbs lost since last in-office visit: 0  Interim History: Madeline Warren is up 5 lbs since her last visit. She is only following the plan at 40%. She is fatigued with tracking.   Subjective:   1. Elevated cholesterol Madeline Warren's last LDL was 134. Her total cholesterol was 223.  2. Vitamin D deficiency Madeline Warren is taking Vitamin D currently.   Assessment/Plan:   1. Elevated cholesterol Cardiovascular risk and specific lipid/LDL goals reviewed.  Madeline Warren will have no trans fats. She will minimize saturated fats except dairy and unprocessed meats. We discussed several lifestyle modifications today and Madeline Warren will continue to work on diet, exercise and weight loss efforts. Orders and follow up as documented in patient record.   Counseling Intensive lifestyle modifications are the first line treatment for this issue. Dietary changes: Increase soluble fiber. Decrease simple carbohydrates. Exercise changes: Moderate to vigorous-intensity aerobic activity 150 minutes per week if tolerated. Lipid-lowering medications: see documented in medical record.  2. Vitamin D deficiency Low Vitamin D level contributes to fatigue and are associated with obesity, breast, and colon cancer. We will refill prescription Vitamin D 50,000 IU every week for 1 month with no refills and Madeline Warren will follow-up for routine testing of  Vitamin D, at least 2-3 times per year to avoid over-replacement.  - Vitamin D, Ergocalciferol, (DRISDOL) 1.25 MG (50000 UNIT) CAPS capsule; Take 1 capsule (50,000 Units total) by mouth every 7 (seven) days.  Dispense: 4 capsule; Refill: 0  3. Obesity, Current BMI 36.3 Elaina is currently in the action stage of change. As such, her goal is to continue with weight loss efforts. She has agreed to keeping a food journal and adhering to recommended goals of 1200 calories and 80 grams of protein vegan.    Madeline Warren will adhere more closely to the plan.   Exercise goals:  Madeline Warren will continue water aerobics.   Behavioral modification strategies: increasing lean protein intake, decreasing simple carbohydrates, increasing vegetables, increasing water intake, decreasing eating out, no skipping meals, meal planning and cooking strategies, keeping healthy foods in the home, and planning for success.  Madeline Warren has agreed to follow-up with our clinic in 4-5 weeks. She was informed of the importance of frequent follow-up visits to maximize her success with intensive lifestyle modifications for her multiple health conditions.   Objective:   Blood pressure 122/81, pulse 69, temperature 97.6 F (36.4 C), height '5\' 5"'$  (1.651 m), weight 218 lb (98.9 kg), last menstrual period 12/22/2013, SpO2 100 %. Body mass index is 36.28 kg/m.  General: Cooperative, alert, well developed, in no acute distress. HEENT: Conjunctivae and lids unremarkable. Cardiovascular: Regular rhythm.  Lungs: Normal work of breathing. Neurologic: No focal deficits.   Lab Results  Component Value Date   CREATININE 0.82 04/04/2021   BUN 9 04/04/2021   NA 144 04/04/2021   K 3.9 04/04/2021   CL 110 04/04/2021   CO2 26  04/04/2021   Lab Results  Component Value Date   ALT 16 04/02/2021   AST 15 04/02/2021   ALKPHOS 43 04/02/2021   BILITOT 0.8 04/02/2021   Lab Results  Component Value Date   HGBA1C 5.1 04/04/2021   HGBA1C 5.2  06/21/2020   HGBA1C 5.6 12/30/2019   Lab Results  Component Value Date   INSULIN 14.8 12/07/2020   INSULIN 13.1 06/21/2020   INSULIN 17.3 12/30/2019   Lab Results  Component Value Date   TSH 3.190 06/21/2020   Lab Results  Component Value Date   CHOL 186 04/04/2021   HDL 45 04/04/2021   LDLCALC 134 (H) 04/04/2021   TRIG 33 04/04/2021   CHOLHDL 4.1 04/04/2021   Lab Results  Component Value Date   VD25OH 50.4 12/07/2020   VD25OH 32.5 06/21/2020   VD25OH 33.2 12/30/2019   Lab Results  Component Value Date   WBC 8.7 04/04/2021   HGB 14.0 04/04/2021   HCT 43.5 04/04/2021   MCV 92.6 04/04/2021   PLT 189 04/04/2021   No results found for: IRON, TIBC, FERRITIN  Attestation Statements:   Reviewed by clinician on day of visit: allergies, medications, problem list, medical history, surgical history, family history, social history, and previous encounter notes.  I, Lizbeth Bark, RMA, am acting as Location manager for [employer], DO.  I have reviewed the above documentation for accuracy and completeness, and I agree with the above. Jearld Lesch, DO

## 2021-08-21 ENCOUNTER — Encounter (INDEPENDENT_AMBULATORY_CARE_PROVIDER_SITE_OTHER): Payer: Self-pay | Admitting: Bariatrics

## 2021-08-22 DIAGNOSIS — E669 Obesity, unspecified: Secondary | ICD-10-CM | POA: Diagnosis not present

## 2021-08-22 DIAGNOSIS — E7800 Pure hypercholesterolemia, unspecified: Secondary | ICD-10-CM | POA: Diagnosis not present

## 2021-08-22 DIAGNOSIS — R0683 Snoring: Secondary | ICD-10-CM | POA: Diagnosis not present

## 2021-08-22 DIAGNOSIS — K219 Gastro-esophageal reflux disease without esophagitis: Secondary | ICD-10-CM | POA: Diagnosis not present

## 2021-08-25 DIAGNOSIS — E669 Obesity, unspecified: Secondary | ICD-10-CM | POA: Diagnosis not present

## 2021-08-25 DIAGNOSIS — Z7189 Other specified counseling: Secondary | ICD-10-CM | POA: Diagnosis not present

## 2021-08-25 DIAGNOSIS — F54 Psychological and behavioral factors associated with disorders or diseases classified elsewhere: Secondary | ICD-10-CM | POA: Diagnosis not present

## 2021-09-08 DIAGNOSIS — G4710 Hypersomnia, unspecified: Secondary | ICD-10-CM | POA: Diagnosis not present

## 2021-09-15 DIAGNOSIS — Z713 Dietary counseling and surveillance: Secondary | ICD-10-CM | POA: Diagnosis not present

## 2021-09-15 DIAGNOSIS — E669 Obesity, unspecified: Secondary | ICD-10-CM | POA: Diagnosis not present

## 2021-09-21 ENCOUNTER — Encounter (INDEPENDENT_AMBULATORY_CARE_PROVIDER_SITE_OTHER): Payer: Self-pay | Admitting: Bariatrics

## 2021-09-21 ENCOUNTER — Ambulatory Visit (INDEPENDENT_AMBULATORY_CARE_PROVIDER_SITE_OTHER): Payer: BC Managed Care – PPO | Admitting: Bariatrics

## 2021-09-21 VITALS — BP 105/71 | HR 74 | Temp 97.9°F | Ht 65.0 in | Wt 221.0 lb

## 2021-09-21 DIAGNOSIS — Z6836 Body mass index (BMI) 36.0-36.9, adult: Secondary | ICD-10-CM | POA: Diagnosis not present

## 2021-09-21 DIAGNOSIS — K529 Noninfective gastroenteritis and colitis, unspecified: Secondary | ICD-10-CM | POA: Diagnosis not present

## 2021-09-21 DIAGNOSIS — E559 Vitamin D deficiency, unspecified: Secondary | ICD-10-CM

## 2021-09-21 DIAGNOSIS — E669 Obesity, unspecified: Secondary | ICD-10-CM

## 2021-09-21 MED ORDER — VITAMIN D (ERGOCALCIFEROL) 1.25 MG (50000 UNIT) PO CAPS: 50000.0000 [IU] | ORAL_CAPSULE | ORAL | 0 refills | Status: AC

## 2021-09-22 DIAGNOSIS — Z0000 Encounter for general adult medical examination without abnormal findings: Secondary | ICD-10-CM | POA: Diagnosis not present

## 2021-09-26 ENCOUNTER — Encounter (INDEPENDENT_AMBULATORY_CARE_PROVIDER_SITE_OTHER): Payer: Self-pay | Admitting: Bariatrics

## 2021-09-26 NOTE — Progress Notes (Signed)
Chief Complaint:   OBESITY Madeline Warren is here to discuss her progress with her obesity treatment plan along with follow-up of her obesity related diagnoses. [name] is on keeping a food journal and adhering to recommended goals of 1200 calories and 80 grams of protein daily and states she is following her eating plan approximately 25% of the time. [name] states she is doing water aerobics for 45 minutes 2 times per week.  Today's visit was #: 26 Starting weight: 237 lbs Starting date: 12/30/2019 Today's weight: 221 lbs Today's date: 09/21/2021 Total lbs lost to date: 16 Total lbs lost since last in-office visit: 0  Interim History: [name] is up 3 pounds since her last visit.  She saw Dr. Evorn Gong at [place].  She is up about 3 pounds in water weight per the bioimpedance scale.  Subjective:   1. Vitamin D deficiency [name] is taking prescription vitamin D.  2. Colitis [name] is not on medications currently.  She notes minimal exacerbation of constipation.  Assessment/Plan:   1. Vitamin D deficiency [name] will continue prescription vitamin D 50,000 units once weekly, and we will refill for 1 month.  - Vitamin D, Ergocalciferol, (DRISDOL) 1.25 MG (50000 UNIT) CAPS capsule; Take 1 capsule (50,000 Units total) by mouth every 7 (seven) days.  Dispense: 4 capsule; Refill: 0  2. Colitis [name] is to increase her water intake, and she will follow-up with her PCP.  3. Obesity, Current BMI 36.8 Amanada is currently in the action stage of change. As such, her goal is to continue with weight loss efforts. She has agreed to keeping a food journal and adhering to recommended goals of 1200 calories and 100 grams of protein daily.   Meal planning and intentional eating were discussed.  She will combine complementary protein (vegan).   Exercise goals: As is.  Behavioral modification strategies: increasing lean protein intake, decreasing simple carbohydrates, increasing vegetables,  increasing water intake, meal planning and cooking strategies, keeping healthy foods in the home, ways to avoid boredom eating, ways to avoid night time snacking, and planning for success.  [name] has agreed to follow-up with our clinic in 4 weeks. She was informed of the importance of frequent follow-up visits to maximize her success with intensive lifestyle modifications for her multiple health conditions.   Objective:   Blood pressure 105/71, pulse 74, temperature 97.9 F (36.6 C), height '5\' 5"'$  (1.651 m), weight 221 lb (100.2 kg), last menstrual period 12/22/2013, SpO2 98 %. Body mass index is 36.78 kg/m.  General: Cooperative, alert, well developed, in no acute distress. HEENT: Conjunctivae and lids unremarkable. Cardiovascular: Regular rhythm.  Lungs: Normal work of breathing. Neurologic: No focal deficits.   Lab Results  Component Value Date   CREATININE 0.82 04/04/2021   BUN 9 04/04/2021   NA 144 04/04/2021   K 3.9 04/04/2021   CL 110 04/04/2021   CO2 26 04/04/2021   Lab Results  Component Value Date   ALT 16 04/02/2021   AST 15 04/02/2021   ALKPHOS 43 04/02/2021   BILITOT 0.8 04/02/2021   Lab Results  Component Value Date   HGBA1C 5.1 04/04/2021   HGBA1C 5.2 06/21/2020   HGBA1C 5.6 12/30/2019   Lab Results  Component Value Date   INSULIN 14.8 12/07/2020   INSULIN 13.1 06/21/2020   INSULIN 17.3 12/30/2019   Lab Results  Component Value Date   TSH 3.190 06/21/2020   Lab Results  Component Value Date   CHOL 186 04/04/2021   HDL  45 04/04/2021   LDLCALC 134 (H) 04/04/2021   TRIG 33 04/04/2021   CHOLHDL 4.1 04/04/2021   Lab Results  Component Value Date   VD25OH 50.4 12/07/2020   VD25OH 32.5 06/21/2020   VD25OH 33.2 12/30/2019   Lab Results  Component Value Date   WBC 8.7 04/04/2021   HGB 14.0 04/04/2021   HCT 43.5 04/04/2021   MCV 92.6 04/04/2021   PLT 189 04/04/2021   No results found for: "IRON", "TIBC", "FERRITIN"  Attestation  Statements:   Reviewed by clinician on day of visit: allergies, medications, problem list, medical history, surgical history, family history, social history, and previous encounter notes.   Wilhemena Durie, am acting as Location manager for [employer], DO.  I have reviewed the above documentation for accuracy and completeness, and I agree with the above. Jearld Lesch, DO

## 2021-09-27 DIAGNOSIS — G4733 Obstructive sleep apnea (adult) (pediatric): Secondary | ICD-10-CM | POA: Diagnosis not present

## 2021-10-03 DIAGNOSIS — E7800 Pure hypercholesterolemia, unspecified: Secondary | ICD-10-CM | POA: Diagnosis not present

## 2021-10-03 DIAGNOSIS — H93291 Other abnormal auditory perceptions, right ear: Secondary | ICD-10-CM | POA: Diagnosis not present

## 2021-10-03 DIAGNOSIS — E669 Obesity, unspecified: Secondary | ICD-10-CM | POA: Diagnosis not present

## 2021-10-03 DIAGNOSIS — K219 Gastro-esophageal reflux disease without esophagitis: Secondary | ICD-10-CM | POA: Diagnosis not present

## 2021-10-03 DIAGNOSIS — Z136 Encounter for screening for cardiovascular disorders: Secondary | ICD-10-CM | POA: Diagnosis not present

## 2021-10-03 DIAGNOSIS — G4733 Obstructive sleep apnea (adult) (pediatric): Secondary | ICD-10-CM | POA: Diagnosis not present

## 2021-10-13 DIAGNOSIS — E669 Obesity, unspecified: Secondary | ICD-10-CM | POA: Diagnosis not present

## 2021-10-13 DIAGNOSIS — Z713 Dietary counseling and surveillance: Secondary | ICD-10-CM | POA: Diagnosis not present

## 2021-10-16 DIAGNOSIS — G4733 Obstructive sleep apnea (adult) (pediatric): Secondary | ICD-10-CM | POA: Diagnosis not present

## 2021-10-16 DIAGNOSIS — G4710 Hypersomnia, unspecified: Secondary | ICD-10-CM | POA: Diagnosis not present

## 2021-10-16 DIAGNOSIS — J4520 Mild intermittent asthma, uncomplicated: Secondary | ICD-10-CM | POA: Diagnosis not present

## 2021-10-16 DIAGNOSIS — E669 Obesity, unspecified: Secondary | ICD-10-CM | POA: Diagnosis not present

## 2021-10-17 DIAGNOSIS — E669 Obesity, unspecified: Secondary | ICD-10-CM | POA: Diagnosis not present

## 2021-10-17 DIAGNOSIS — K219 Gastro-esophageal reflux disease without esophagitis: Secondary | ICD-10-CM | POA: Diagnosis not present

## 2021-10-17 DIAGNOSIS — G4730 Sleep apnea, unspecified: Secondary | ICD-10-CM | POA: Diagnosis not present

## 2021-10-17 DIAGNOSIS — Z01818 Encounter for other preprocedural examination: Secondary | ICD-10-CM | POA: Diagnosis not present

## 2021-10-18 ENCOUNTER — Encounter (INDEPENDENT_AMBULATORY_CARE_PROVIDER_SITE_OTHER): Payer: Self-pay

## 2021-10-31 ENCOUNTER — Ambulatory Visit (INDEPENDENT_AMBULATORY_CARE_PROVIDER_SITE_OTHER): Payer: BC Managed Care – PPO | Admitting: Bariatrics

## 2021-11-01 DIAGNOSIS — Z91018 Allergy to other foods: Secondary | ICD-10-CM | POA: Diagnosis not present

## 2021-11-01 DIAGNOSIS — Z9989 Dependence on other enabling machines and devices: Secondary | ICD-10-CM | POA: Diagnosis not present

## 2021-11-01 DIAGNOSIS — J45909 Unspecified asthma, uncomplicated: Secondary | ICD-10-CM | POA: Diagnosis not present

## 2021-11-01 DIAGNOSIS — Z6836 Body mass index (BMI) 36.0-36.9, adult: Secondary | ICD-10-CM | POA: Diagnosis not present

## 2021-11-01 DIAGNOSIS — K219 Gastro-esophageal reflux disease without esophagitis: Secondary | ICD-10-CM | POA: Diagnosis not present

## 2021-11-01 DIAGNOSIS — E7800 Pure hypercholesterolemia, unspecified: Secondary | ICD-10-CM | POA: Diagnosis not present

## 2021-11-01 DIAGNOSIS — Z880 Allergy status to penicillin: Secondary | ICD-10-CM | POA: Diagnosis not present

## 2021-11-01 DIAGNOSIS — G4733 Obstructive sleep apnea (adult) (pediatric): Secondary | ICD-10-CM | POA: Diagnosis not present

## 2021-11-06 DIAGNOSIS — Z91018 Allergy to other foods: Secondary | ICD-10-CM | POA: Diagnosis not present

## 2021-11-06 DIAGNOSIS — Z881 Allergy status to other antibiotic agents status: Secondary | ICD-10-CM | POA: Diagnosis not present

## 2021-11-06 DIAGNOSIS — G4733 Obstructive sleep apnea (adult) (pediatric): Secondary | ICD-10-CM | POA: Diagnosis not present

## 2021-11-06 DIAGNOSIS — Z79899 Other long term (current) drug therapy: Secondary | ICD-10-CM | POA: Diagnosis not present

## 2021-11-06 DIAGNOSIS — Z20822 Contact with and (suspected) exposure to covid-19: Secondary | ICD-10-CM | POA: Diagnosis not present

## 2021-11-06 DIAGNOSIS — R531 Weakness: Secondary | ICD-10-CM | POA: Diagnosis not present

## 2021-11-06 DIAGNOSIS — Z8616 Personal history of COVID-19: Secondary | ICD-10-CM | POA: Diagnosis not present

## 2021-11-06 DIAGNOSIS — E860 Dehydration: Secondary | ICD-10-CM | POA: Diagnosis not present

## 2021-11-16 DIAGNOSIS — E669 Obesity, unspecified: Secondary | ICD-10-CM | POA: Diagnosis not present

## 2021-11-16 DIAGNOSIS — Z713 Dietary counseling and surveillance: Secondary | ICD-10-CM | POA: Diagnosis not present

## 2021-11-20 DIAGNOSIS — R000 Tachycardia, unspecified: Secondary | ICD-10-CM | POA: Diagnosis not present

## 2021-11-22 DIAGNOSIS — E860 Dehydration: Secondary | ICD-10-CM | POA: Diagnosis not present

## 2022-02-16 DIAGNOSIS — Z713 Dietary counseling and surveillance: Secondary | ICD-10-CM | POA: Diagnosis not present

## 2022-04-18 IMAGING — CT CT ABD-PELV W/ CM
2 of 5 series · 16 of 46 positions shown, 18 images · IV contrast (APPLIED)
Comparison: None.

CLINICAL DATA: Left lower quadrant abdominal pain.

EXAM:
CT ABDOMEN AND PELVIS WITH CONTRAST
TECHNIQUE: Multidetector CT imaging of the abdomen and pelvis was performed
using the standard protocol following bolus administration of
intravenous contrast.

[Series 3: abdomen 5.0 · axial · 0.94mm/px · z∈[+806,+1226]mm · 13 of 98 slices shown, 15 images]
[im 7/98  soft-tissue]
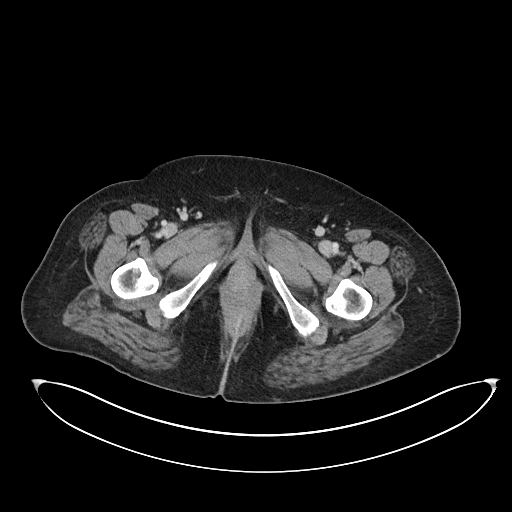
[im 7/98  bone]
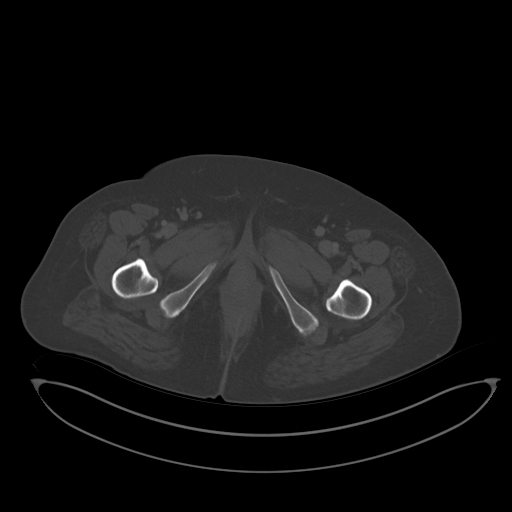
[im 13/98  soft-tissue]
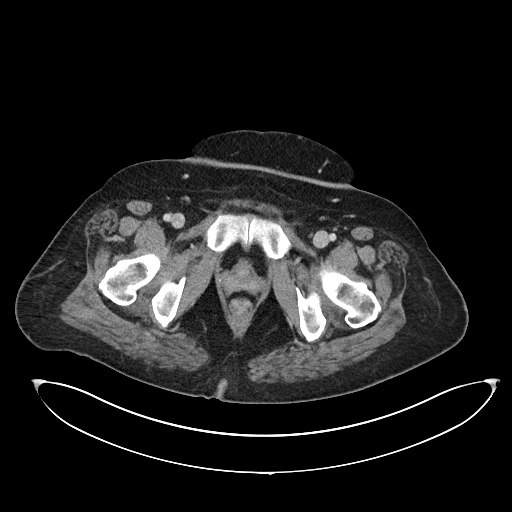
[im 20/98  soft-tissue]
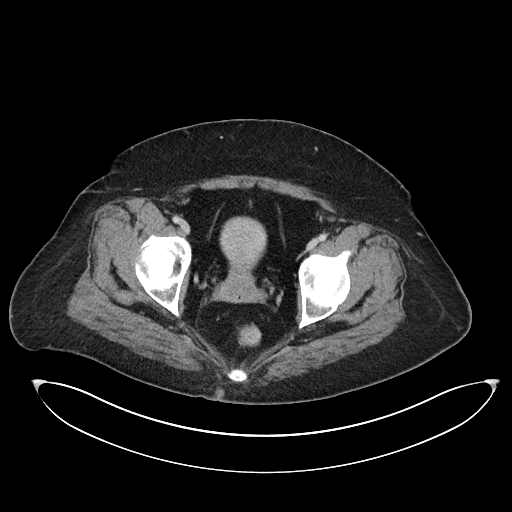
[im 26/98  soft-tissue]
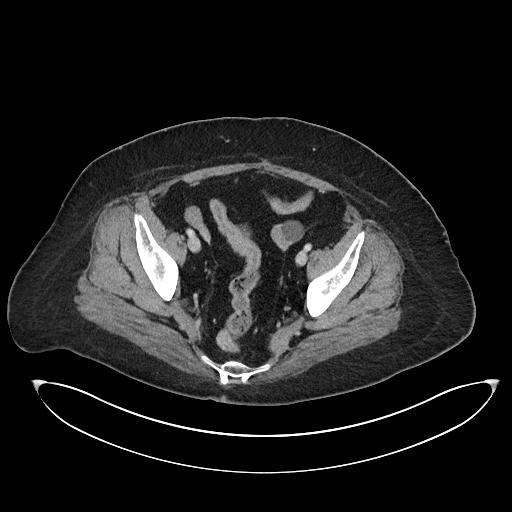
[im 33/98  soft-tissue]
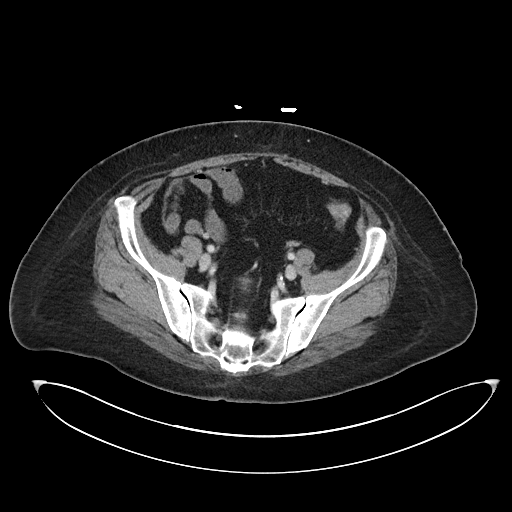
[im 39/98  soft-tissue]
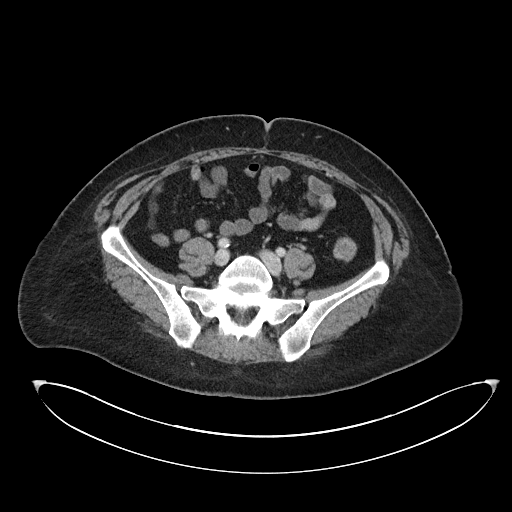
[im 52/98  soft-tissue]
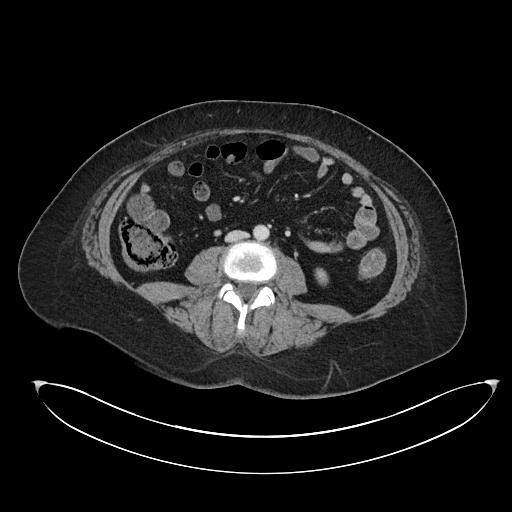
[im 59/98  soft-tissue]
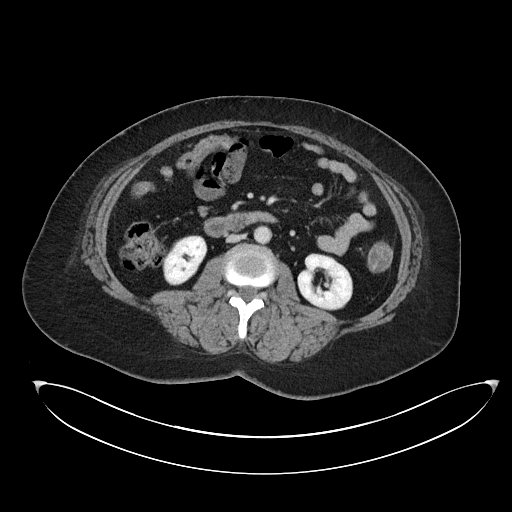
[im 65/98  soft-tissue]
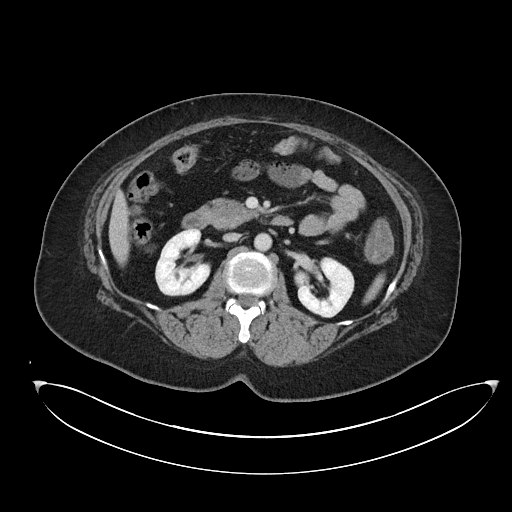
[im 65/98  bone]
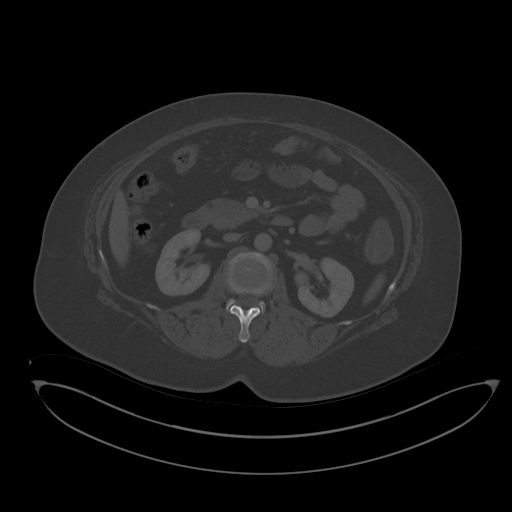
[im 72/98  soft-tissue]
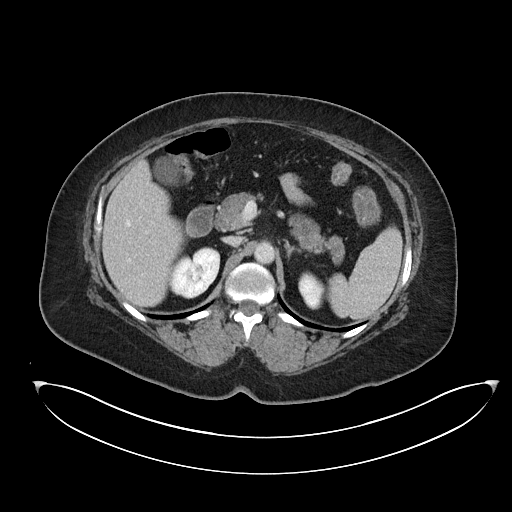
[im 78/98  soft-tissue]
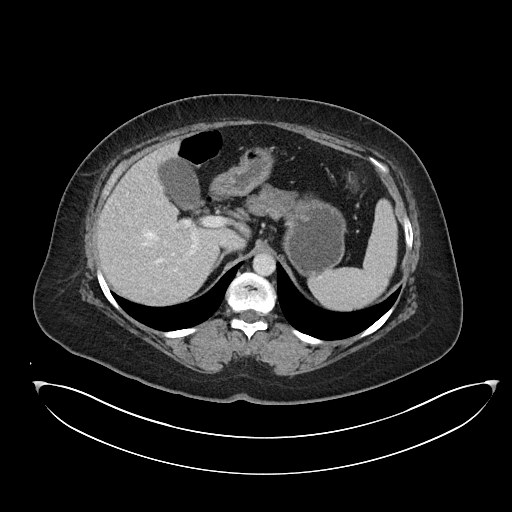
[im 85/98  soft-tissue]
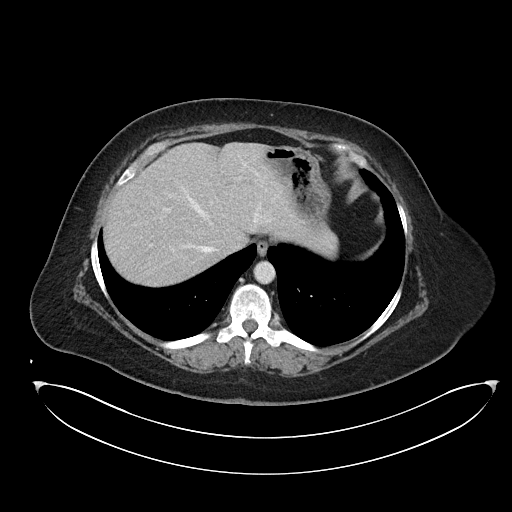
[im 91/98  soft-tissue]
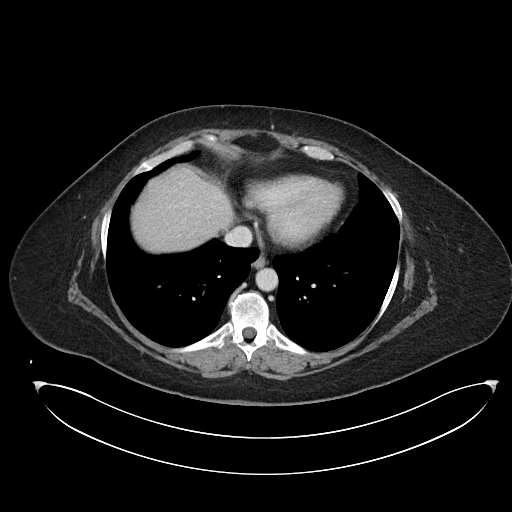

[Series 6: abdomen 3.0 mpr cor · coronal · 0.93mm/px · 3 of 100 slices shown]
[im 34/100  soft-tissue]
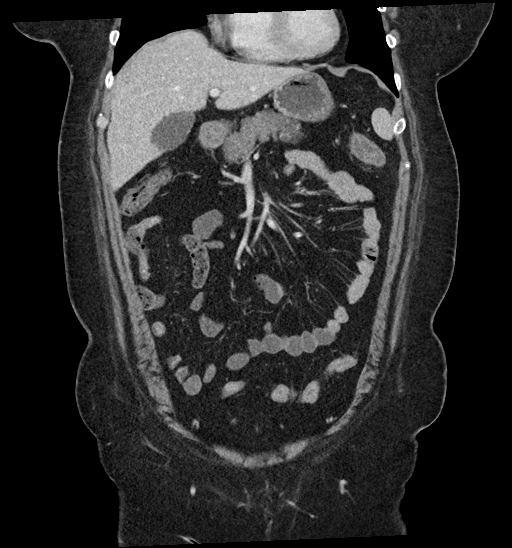
[im 45/100  soft-tissue]
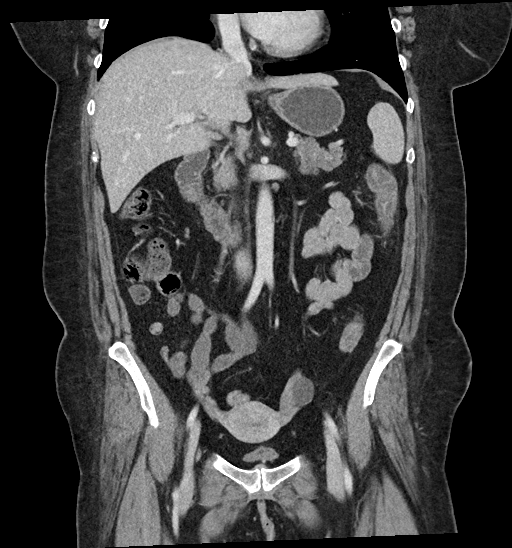
[im 56/100  soft-tissue]
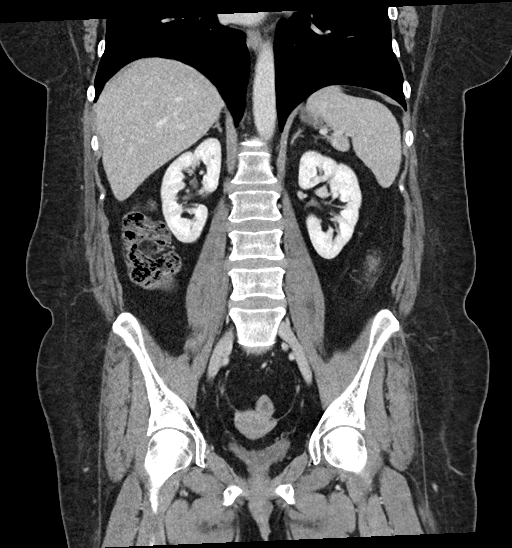

[16 of 46 positions shown; findings below may reference images not displayed]

RADIATION DOSE REDUCTION: This exam was performed according to the
departmental dose-optimization program which includes automated
exposure control, adjustment of the mA and/or kV according to
patient size and/or use of iterative reconstruction technique.

CONTRAST:  100mL OMNIPAQUE IOHEXOL 300 MG/ML  SOLN
FINDINGS: Lower chest: No acute abnormality.

Hepatobiliary: No focal liver abnormality is seen. No gallstones,
gallbladder wall thickening, or biliary dilatation.

Pancreas: Unremarkable. No pancreatic ductal dilatation or
surrounding inflammatory changes.

Spleen: Normal in size without focal abnormality.

Adrenals/Urinary Tract: Adrenal glands are unremarkable. Kidneys are
normal, without renal calculi, focal lesion, or hydronephrosis.
Bladder is unremarkable.

Stomach/Bowel: The stomach is within normal limits. There is colonic
wall thickening and fat stranding involving the distal transverse
and descending colon. No bowel obstruction, free air or pneumatosis.
A normal appendix is seen in the right lower quadrant.

Vascular/Lymphatic: No significant vascular findings are present. No
enlarged abdominal or pelvic lymph nodes.

Reproductive: The uterus is unremarkable. A cyst is present in the
left ovary measuring 2.2 cm, likely dominant follicle.

Other: No abdominal wall hernia or abnormality. No abdominopelvic
ascites.

Musculoskeletal: No acute osseous abnormality.
IMPRESSION: Findings compatible with colitis involving the distal transverse
colon and descending colon.

## 2022-04-20 DIAGNOSIS — M546 Pain in thoracic spine: Secondary | ICD-10-CM | POA: Diagnosis not present

## 2022-04-20 DIAGNOSIS — M542 Cervicalgia: Secondary | ICD-10-CM | POA: Diagnosis not present

## 2022-04-20 DIAGNOSIS — M5459 Other low back pain: Secondary | ICD-10-CM | POA: Diagnosis not present

## 2022-04-30 DIAGNOSIS — Z9884 Bariatric surgery status: Secondary | ICD-10-CM | POA: Diagnosis not present

## 2022-04-30 DIAGNOSIS — K912 Postsurgical malabsorption, not elsewhere classified: Secondary | ICD-10-CM | POA: Diagnosis not present

## 2022-05-25 DIAGNOSIS — G4733 Obstructive sleep apnea (adult) (pediatric): Secondary | ICD-10-CM | POA: Diagnosis not present

## 2022-05-25 DIAGNOSIS — Z9884 Bariatric surgery status: Secondary | ICD-10-CM | POA: Diagnosis not present

## 2022-05-25 DIAGNOSIS — K219 Gastro-esophageal reflux disease without esophagitis: Secondary | ICD-10-CM | POA: Diagnosis not present

## 2022-05-25 DIAGNOSIS — Z1330 Encounter for screening examination for mental health and behavioral disorders, unspecified: Secondary | ICD-10-CM | POA: Diagnosis not present

## 2022-05-25 DIAGNOSIS — E7800 Pure hypercholesterolemia, unspecified: Secondary | ICD-10-CM | POA: Diagnosis not present

## 2022-06-18 ENCOUNTER — Other Ambulatory Visit: Payer: Self-pay | Admitting: Gastroenterology

## 2022-06-18 DIAGNOSIS — K219 Gastro-esophageal reflux disease without esophagitis: Secondary | ICD-10-CM

## 2022-06-27 DIAGNOSIS — Z6828 Body mass index (BMI) 28.0-28.9, adult: Secondary | ICD-10-CM | POA: Diagnosis not present

## 2022-06-27 DIAGNOSIS — Z01419 Encounter for gynecological examination (general) (routine) without abnormal findings: Secondary | ICD-10-CM | POA: Diagnosis not present

## 2022-06-28 DIAGNOSIS — M76891 Other specified enthesopathies of right lower limb, excluding foot: Secondary | ICD-10-CM | POA: Diagnosis not present

## 2022-06-28 DIAGNOSIS — M25561 Pain in right knee: Secondary | ICD-10-CM | POA: Diagnosis not present

## 2022-06-28 DIAGNOSIS — G8929 Other chronic pain: Secondary | ICD-10-CM | POA: Diagnosis not present

## 2022-07-11 DIAGNOSIS — Z1231 Encounter for screening mammogram for malignant neoplasm of breast: Secondary | ICD-10-CM | POA: Diagnosis not present

## 2022-07-11 DIAGNOSIS — N83202 Unspecified ovarian cyst, left side: Secondary | ICD-10-CM | POA: Diagnosis not present

## 2022-07-11 DIAGNOSIS — N83209 Unspecified ovarian cyst, unspecified side: Secondary | ICD-10-CM | POA: Diagnosis not present

## 2022-08-14 DIAGNOSIS — H16142 Punctate keratitis, left eye: Secondary | ICD-10-CM | POA: Diagnosis not present

## 2022-08-16 DIAGNOSIS — M5459 Other low back pain: Secondary | ICD-10-CM | POA: Diagnosis not present

## 2022-08-16 DIAGNOSIS — M542 Cervicalgia: Secondary | ICD-10-CM | POA: Diagnosis not present

## 2022-08-16 DIAGNOSIS — M546 Pain in thoracic spine: Secondary | ICD-10-CM | POA: Diagnosis not present

## 2022-10-26 DIAGNOSIS — S91302A Unspecified open wound, left foot, initial encounter: Secondary | ICD-10-CM | POA: Diagnosis not present

## 2022-10-26 DIAGNOSIS — L03116 Cellulitis of left lower limb: Secondary | ICD-10-CM | POA: Diagnosis not present

## 2022-11-02 DIAGNOSIS — E7800 Pure hypercholesterolemia, unspecified: Secondary | ICD-10-CM | POA: Diagnosis not present

## 2022-11-02 DIAGNOSIS — K219 Gastro-esophageal reflux disease without esophagitis: Secondary | ICD-10-CM | POA: Diagnosis not present

## 2022-11-02 DIAGNOSIS — E88819 Insulin resistance, unspecified: Secondary | ICD-10-CM | POA: Diagnosis not present

## 2022-11-02 DIAGNOSIS — Z9884 Bariatric surgery status: Secondary | ICD-10-CM | POA: Diagnosis not present

## 2022-11-02 DIAGNOSIS — E559 Vitamin D deficiency, unspecified: Secondary | ICD-10-CM | POA: Diagnosis not present

## 2022-11-02 DIAGNOSIS — K912 Postsurgical malabsorption, not elsewhere classified: Secondary | ICD-10-CM | POA: Diagnosis not present

## 2022-11-19 DIAGNOSIS — M546 Pain in thoracic spine: Secondary | ICD-10-CM | POA: Diagnosis not present

## 2022-11-19 DIAGNOSIS — M542 Cervicalgia: Secondary | ICD-10-CM | POA: Diagnosis not present

## 2022-11-19 DIAGNOSIS — M5459 Other low back pain: Secondary | ICD-10-CM | POA: Diagnosis not present

## 2022-11-23 DIAGNOSIS — G4733 Obstructive sleep apnea (adult) (pediatric): Secondary | ICD-10-CM | POA: Diagnosis not present

## 2022-11-23 DIAGNOSIS — E7800 Pure hypercholesterolemia, unspecified: Secondary | ICD-10-CM | POA: Diagnosis not present

## 2022-11-23 DIAGNOSIS — Z9884 Bariatric surgery status: Secondary | ICD-10-CM | POA: Diagnosis not present

## 2022-11-23 DIAGNOSIS — K219 Gastro-esophageal reflux disease without esophagitis: Secondary | ICD-10-CM | POA: Diagnosis not present

## 2022-11-28 DIAGNOSIS — M542 Cervicalgia: Secondary | ICD-10-CM | POA: Diagnosis not present

## 2022-11-28 DIAGNOSIS — M5459 Other low back pain: Secondary | ICD-10-CM | POA: Diagnosis not present

## 2022-11-28 DIAGNOSIS — M546 Pain in thoracic spine: Secondary | ICD-10-CM | POA: Diagnosis not present

## 2022-11-29 DIAGNOSIS — M25511 Pain in right shoulder: Secondary | ICD-10-CM | POA: Diagnosis not present

## 2022-12-05 DIAGNOSIS — M542 Cervicalgia: Secondary | ICD-10-CM | POA: Diagnosis not present

## 2022-12-05 DIAGNOSIS — M546 Pain in thoracic spine: Secondary | ICD-10-CM | POA: Diagnosis not present

## 2022-12-05 DIAGNOSIS — M5459 Other low back pain: Secondary | ICD-10-CM | POA: Diagnosis not present

## 2023-01-02 ENCOUNTER — Telehealth: Payer: Self-pay | Admitting: Gastroenterology

## 2023-01-02 DIAGNOSIS — K219 Gastro-esophageal reflux disease without esophagitis: Secondary | ICD-10-CM

## 2023-01-02 MED ORDER — PANTOPRAZOLE SODIUM 40 MG PO TBEC: 40.0000 mg | DELAYED_RELEASE_TABLET | Freq: Every day | ORAL | 1 refills | Status: AC

## 2023-01-02 NOTE — Telephone Encounter (Signed)
Spoke with patient and schedule follow up for 04/12/23 @ 3 pm with Hyacinth Meeker, PA. Sent refill to pharmacy.

## 2023-01-02 NOTE — Telephone Encounter (Signed)
Inbound call from patient requesting a refill for pantoprazole medication. Please advise, thank you.

## 2023-04-01 DIAGNOSIS — W44F3XA Food entering into or through a natural orifice, initial encounter: Secondary | ICD-10-CM | POA: Diagnosis not present

## 2023-04-01 DIAGNOSIS — K219 Gastro-esophageal reflux disease without esophagitis: Secondary | ICD-10-CM | POA: Diagnosis not present

## 2023-04-01 DIAGNOSIS — T17928A Food in respiratory tract, part unspecified causing other injury, initial encounter: Secondary | ICD-10-CM | POA: Diagnosis not present

## 2023-04-12 ENCOUNTER — Ambulatory Visit: Payer: BC Managed Care – PPO | Admitting: Physician Assistant

## 2023-05-17 DIAGNOSIS — B9689 Other specified bacterial agents as the cause of diseases classified elsewhere: Secondary | ICD-10-CM | POA: Diagnosis not present

## 2023-05-17 DIAGNOSIS — J019 Acute sinusitis, unspecified: Secondary | ICD-10-CM | POA: Diagnosis not present

## 2023-07-12 DIAGNOSIS — S46311A Strain of muscle, fascia and tendon of triceps, right arm, initial encounter: Secondary | ICD-10-CM | POA: Diagnosis not present

## 2023-07-18 DIAGNOSIS — M79621 Pain in right upper arm: Secondary | ICD-10-CM | POA: Diagnosis not present
